# Patient Record
Sex: Male | Born: 1960 | Race: White | Hispanic: No | Marital: Single | Smoking: Never smoker
Health system: Southern US, Community
[De-identification: ages and names within clinical notes are randomized; demographics above are authoritative.]

## PROBLEM LIST (undated history)

## (undated) DIAGNOSIS — H9319 Tinnitus, unspecified ear: Secondary | ICD-10-CM

## (undated) DIAGNOSIS — I10 Essential (primary) hypertension: Secondary | ICD-10-CM

## (undated) DIAGNOSIS — F5101 Primary insomnia: Secondary | ICD-10-CM

## (undated) DIAGNOSIS — E7800 Pure hypercholesterolemia, unspecified: Secondary | ICD-10-CM

## (undated) DIAGNOSIS — E785 Hyperlipidemia, unspecified: Secondary | ICD-10-CM

## (undated) HISTORY — DX: Hyperlipidemia, unspecified: E78.5

## (undated) HISTORY — DX: Tinnitus, unspecified ear: H93.19

## (undated) HISTORY — PX: TONSILLECTOMY: SUR1361

## (undated) HISTORY — DX: Pure hypercholesterolemia, unspecified: E78.00

## (undated) HISTORY — DX: Essential (primary) hypertension: I10

## (undated) HISTORY — DX: Primary insomnia: F51.01

---
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
## 2016-02-14 ENCOUNTER — Emergency Department (HOSPITAL_COMMUNITY)
Admission: EM | Admit: 2016-02-14 | Discharge: 2016-02-14 | Disposition: A | Discharge status: Home / Self Care | Payer: BLUE CROSS/BLUE SHIELD | Attending: Emergency Medicine | Admitting: Emergency Medicine

## 2016-02-14 ENCOUNTER — Encounter (HOSPITAL_COMMUNITY): Payer: Self-pay | Admitting: *Deleted

## 2016-02-14 DIAGNOSIS — Z7982 Long term (current) use of aspirin: Secondary | ICD-10-CM | POA: Diagnosis not present

## 2016-02-14 DIAGNOSIS — X58XXXA Exposure to other specified factors, initial encounter: Secondary | ICD-10-CM | POA: Diagnosis not present

## 2016-02-14 DIAGNOSIS — S40811A Abrasion of right upper arm, initial encounter: Secondary | ICD-10-CM | POA: Diagnosis not present

## 2016-02-14 DIAGNOSIS — Y9389 Activity, other specified: Secondary | ICD-10-CM | POA: Insufficient documentation

## 2016-02-14 DIAGNOSIS — Y92137 Garden or yard on military base as the place of occurrence of the external cause: Secondary | ICD-10-CM | POA: Diagnosis not present

## 2016-02-14 DIAGNOSIS — S51801A Unspecified open wound of right forearm, initial encounter: Secondary | ICD-10-CM | POA: Insufficient documentation

## 2016-02-14 DIAGNOSIS — Y990 Civilian activity done for income or pay: Secondary | ICD-10-CM | POA: Insufficient documentation

## 2016-02-14 DIAGNOSIS — Z23 Encounter for immunization: Secondary | ICD-10-CM | POA: Diagnosis not present

## 2016-02-14 DIAGNOSIS — R42 Dizziness and giddiness: Secondary | ICD-10-CM | POA: Insufficient documentation

## 2016-02-14 DIAGNOSIS — S4991XA Unspecified injury of right shoulder and upper arm, initial encounter: Secondary | ICD-10-CM | POA: Diagnosis present

## 2016-02-14 MED ORDER — TETANUS-DIPHTH-ACELL PERTUSSIS 5-2.5-18.5 LF-MCG/0.5 IM SUSP
0.5000 mL | Freq: Once | INTRAMUSCULAR | Status: AC
  Administered 2016-02-14: 0.5 mL via INTRAMUSCULAR
  Filled 2016-02-14: qty 0.5

## 2016-02-14 MED ORDER — DOXYCYCLINE HYCLATE 100 MG PO TABS
200.0000 mg | ORAL_TABLET | Freq: Once | ORAL | Status: AC
  Administered 2016-02-14: 200 mg via ORAL
  Filled 2016-02-14: qty 2

## 2016-02-14 NOTE — ED Notes (Signed)
Pt given a cup of water 

## 2016-02-14 NOTE — ED Notes (Signed)
Per EMS pt was working in the rose garden and stuck his hand down in it a felt a sting sensation/bite. Pt believes it could be a snake but did not see an actual snake. Pt has 2 puncture marks to right forearm with minimal swelling.

## 2016-02-14 NOTE — ED Provider Notes (Signed)
Patient brought by EMS he felt mild generalized weakness after he noticed to tiny puncture marks at his right forearm while working regarding this morning. He felt a stinging sensation in his right forearm. EMS felt the possibility of a snake bite however patient did not see a snake. On exam patient is alert and in no distress right forearm shows 2 tiny puncture wounds approximately 2 cm apart  Doug SouSam Jaymz Traywick, MD 02/14/16 1649

## 2016-02-14 NOTE — ED Notes (Addendum)
Pt c/o weakness and fatigue swelling at puncture site remains the same

## 2016-02-14 NOTE — ED Provider Notes (Signed)
CSN: [id number]     Arrival date & time 02/14/16  1232 History   First MD Initiated Contact with Patient 02/14/16 1302     Chief Complaint  Patient presents with  . Animal Bite     (Consider location/radiation/quality/duration/timing/severity/associated sxs/prior Treatment) HPI Comments: Patient is a previously healthy 55 year old male who presents today with a possible abnormal bite to his right forearm. Patient reports he was working in his friend's rose garden this morning around 10 AM when he reached into an area out of vision and felt a stinging/light sensation. He then saw 2 puncture wounds/ scratches on his right forearm. The wounds are about 1 inch apart. Within 30-45 minutes the patient began feeling flushed and lightheaded. The patient was concerned that maybe he was bitten by a snake, although he did not see any snake. The patient's also reports that he pulled a tick off his lower abdomen 3 days ago. It has been red and itchy, and he has been watching it. It has not increased in size since he found the tick. Patient denies any chest pain, shortness of breath, abdominal pain, nausea, vomiting, dysuria, headaches. Patient states he is feeling well now, with the exception of some facial flushing. Tetanus not up-to-date.  Patient is a 55 y.o. male presenting with animal bite. The history is provided by the patient.  Animal Bite Associated symptoms: no fever and no rash     History reviewed. No pertinent past medical history. Past Surgical History  Procedure Laterality Date  . Tympanoplasty     No family history on file. Social History  Substance Use Topics  . Smoking status: Never Smoker   . Smokeless tobacco: Never Used  . Alcohol Use: Yes     Comment: occassional    Review of Systems  Constitutional: Negative for fever and chills.  HENT: Negative for facial swelling.   Respiratory: Negative for shortness of breath.   Cardiovascular: Negative for chest pain.   Gastrointestinal: Negative for nausea, vomiting and abdominal pain.  Genitourinary: Negative for dysuria.  Musculoskeletal: Negative for back pain.  Skin: Positive for wound. Negative for rash.  Neurological: Positive for light-headedness. Negative for headaches.  Psychiatric/Behavioral: The patient is not nervous/anxious.       Allergies  Review of patient's allergies indicates no known allergies.  Home Medications   Prior to Admission medications   Medication Sig Start Date End Date Taking? Authorizing Provider  aspirin EC 81 MG tablet Take 81 mg by mouth at bedtime.   Yes Historical Provider, MD  fluticasone (FLONASE) 50 MCG/ACT nasal spray Place 1 spray into both nostrils daily as needed for allergies (and congestion).   Yes Historical Provider, MD  ibuprofen (ADVIL,MOTRIN) 200 MG tablet Take 400-600 mg by mouth at bedtime as needed for moderate pain.   Yes Historical Provider, MD   BP 127/86 mmHg  Pulse 73  Temp(Src) 98.7 F (37.1 C) (Oral)  Resp 16  Ht 5\' 8"  (1.727 m)  Wt 104.327 kg  BMI 34.98 kg/m2  SpO2 97% Physical Exam  Constitutional: He appears well-developed and well-nourished. No distress.  HENT:  Head: Normocephalic and atraumatic.  Eyes: Conjunctivae are normal. Pupils are equal, round, and reactive to light. Right eye exhibits no discharge. Left eye exhibits no discharge. No scleral icterus.  Neck: Normal range of motion. Neck supple. No thyromegaly present.  Cardiovascular: Normal rate, regular rhythm, normal heart sounds and intact distal pulses.  Exam reveals no gallop and no friction rub.   No murmur  heard. Pulmonary/Chest: Effort normal and breath sounds normal. No stridor. No respiratory distress. He has no wheezes. He has no rales.  Abdominal: Soft. Bowel sounds are normal. He exhibits no distension. There is no tenderness. There is no rebound and no guarding.  Musculoskeletal: He exhibits no edema.  Lymphadenopathy:    He has no cervical  adenopathy.  Neurological: He is alert. Coordination normal.  Skin: Skin is warm and dry. No rash noted. He is not diaphoretic. No pallor.  2 small wounds 1 inch apart on right posterior forearm; minimal surrounding area of edema, no erythema, mild tenderness on palpation; no streaking; area demarcated by circle surrounding edema  Psychiatric: He has a normal mood and affect.  Nursing note and vitals reviewed.   ED Course  Procedures (including critical care time) Labs Review Labs Reviewed - No data to display  Imaging Review No results found. I have personally reviewed and evaluated these images and lab results as part of my medical decision-making.   EKG Interpretation None      1:28p Will watch the patient for symptom course 3:00p Patient's heaviness in legs and lightheadedness mostly resolved; drinking water 3:26p Patient's symptoms same as 3:00p; discussed prophylactic doxycycline and patient agrees to take 1  dose here in ED and follow-up with PCP; also updated tetanus in ED  MDM   Patient's symptoms improved over ED course. Patient's swelling and tenderness around wounds did not increase. Patient given prophylactic doxycycline as stated above for tick bite. Tetanus updated in ED. Patient stable throughout course and at discharge. Patient to follow-up with PCP as needed for any worsening or unresolving symptoms. Strict return precautions given. Patient discussed with Dr. Dub Mikes agreement with plan.  Final diagnoses:  Abrasion of right arm, initial encounter  [address] PA-C 02/14/16 2020  Doug Sou, MD 02/15/16 1659

## 2016-02-14 NOTE — ED Notes (Signed)
Pt states that he now remembers that he was bite by a tic on waist, area appears to be red

## 2016-02-14 NOTE — Discharge Instructions (Signed)
You were given doxycycline today to prevent any possible Lyme disease or [facility] spotted fever infection from your tick bite. Your tetanus shot has also been updated.  Treatment: Put antibiotic ointment on wound daily to prevent infection.  Follow-up: Please follow-up with your primary care provider if your symptoms are worsening or not improving. Please return to emergency department if he develops any new or worsening symptoms.   Wound Care Taking care of your wound properly can help to prevent pain and infection. It can also help your wound to heal more quickly.  HOW TO CARE FOR YOUR WOUND  Take or apply over-the-counter and prescription medicines only as told by your health care provider.  If you were prescribed antibiotic medicine, take or apply it as told by your health care provider. Do not stop using the antibiotic even if your condition improves.  Clean the wound each day or as told by your health care provider.  Wash the wound with mild soap and water.  Rinse the wound with water to remove all soap.  Pat the wound dry with a clean towel. Do not rub it.  There are many different ways to close and cover a wound. For example, a wound can be covered with stitches (sutures), skin glue, or adhesive strips. Follow instructions from your health care provider about:  How to take care of your wound.  When and how you should change your bandage (dressing).  When you should remove your dressing.  Removing whatever was used to close your wound.  Check your wound every day for signs of infection. Watch for:  Redness, swelling, or pain.  Fluid, blood, or pus.  Keep the dressing dry until your health care provider says it can be removed. Do not take baths, swim, use a hot tub, or do anything that would put your wound underwater until your health care provider approves.  Raise (elevate) the injured area above the level of your heart while you are sitting or lying down.  Do  not scratch or pick at the wound.  Keep all follow-up visits as told by your health care provider. This is important. SEEK MEDICAL CARE IF:  You received a tetanus shot and you have swelling, severe pain, redness, or bleeding at the injection site.  You have a fever.  Your pain is not controlled with medicine.  You have increased redness, swelling, or pain at the site of your wound.  You have fluid, blood, or pus coming from your wound.  You notice a bad smell coming from your wound or your dressing. SEEK IMMEDIATE MEDICAL CARE IF:  You have a red streak going away from your wound.   This information is not intended to replace advice given to you by your health care provider. Make sure you discuss any questions you have with your health care provider.   Document Released: 07/26/2008 Document Revised: 03/03/2015 Document Reviewed: 10/13/2014 Elsevier Interactive Patient Education [employer]2016 [employer] Inc.

## 2016-02-14 NOTE — ED Notes (Signed)
Pt. Verbalizes understanding of d/c instructions and displays no s/s of distress at this time. VS stable. Pt. Verbalizes no concerns at this time. Pt. Ambulatory out of the unit with steady gait.

## 2016-02-15 ENCOUNTER — Emergency Department (HOSPITAL_BASED_OUTPATIENT_CLINIC_OR_DEPARTMENT_OTHER)
Admission: EM | Admit: 2016-02-15 | Discharge: 2016-02-15 | Disposition: A | Discharge status: Home / Self Care | Payer: BLUE CROSS/BLUE SHIELD | Attending: Emergency Medicine | Admitting: Emergency Medicine

## 2016-02-15 ENCOUNTER — Encounter (HOSPITAL_BASED_OUTPATIENT_CLINIC_OR_DEPARTMENT_OTHER): Payer: Self-pay | Admitting: *Deleted

## 2016-02-15 DIAGNOSIS — Z7982 Long term (current) use of aspirin: Secondary | ICD-10-CM | POA: Diagnosis not present

## 2016-02-15 DIAGNOSIS — L539 Erythematous condition, unspecified: Secondary | ICD-10-CM | POA: Insufficient documentation

## 2016-02-15 DIAGNOSIS — R42 Dizziness and giddiness: Secondary | ICD-10-CM | POA: Diagnosis not present

## 2016-02-15 DIAGNOSIS — R232 Flushing: Secondary | ICD-10-CM | POA: Insufficient documentation

## 2016-02-15 DIAGNOSIS — L089 Local infection of the skin and subcutaneous tissue, unspecified: Secondary | ICD-10-CM | POA: Insufficient documentation

## 2016-02-15 MED ORDER — MECLIZINE HCL 12.5 MG PO TABS: 12.5000 mg | ORAL_TABLET | Freq: Three times a day (TID) | ORAL | Status: DC | PRN

## 2016-02-15 NOTE — Discharge Instructions (Signed)
You have been seen today for flushing and lightheadedness. You have no abnormalities on exam or in your vital signs. Follow up with PCP as needed if symptoms continue. Return to ED should symptoms worsen.

## 2016-02-15 NOTE — ED Notes (Signed)
C/o ?bite to right forearm. Was seen [facility] ED yesterday for same. Pt arm was marked yesterday an is not outside of [place]. Pt states area looks more reddened to him. C/o episodes of feeling weak, sweating, and feeling hot since bite.

## 2016-02-15 NOTE — ED Provider Notes (Signed)
CSN: [id number]     Arrival date & time 02/15/16  7564 History   First MD Initiated Contact with Patient 02/15/16 1008     Chief Complaint  Patient presents with  . Insect Bite     (Consider location/radiation/quality/duration/timing/severity/associated sxs/prior Treatment) HPI   Herbert Cantu is a 55 y.o. male, patient with no pertinent past medical history, presenting to the ED with Complaint of an episode of feeling flushed and lightheaded that occurred after his shower this morning. Patient states that he was seen in the [facility] ED yesterday for what he thought may have been a bite from an insect or snake. Patient was observed for hours in the ED without changes in his wound or status. She states that he had similar feelings of flushing and lightheadedness yesterday after the wound occurred. Today's episode passed within 30 seconds. Patient denies falls or head trauma. Further denies nausea/vomiting, chest pain, shortness of breath, fever/chills, or any other complaints or symptoms.   HPI by Buel Ream, PA-C from February 14, 2016: "Patient is a previously healthy 55 year old male who presents today with a possible abnormal bite to his right forearm. Patient reports he was working in his friend's rose garden this morning around 10 AM when he reached into an area out of vision and felt a stinging/light sensation. He then saw 2 puncture wounds/ scratches on his right forearm. The wounds are about 1 inch apart. Within 30-45 minutes the patient began feeling flushed and lightheaded. The patient was concerned that maybe he was bitten by a snake, although he did not see any snake. The patient's also reports that he pulled a tick off his lower abdomen 3 days ago. It has been red and itchy, and he has been watching it. It has not increased in size since he found the tick. Patient denies any chest pain, shortness of breath, abdominal pain, nausea, vomiting, dysuria, headaches. Patient states he is  feeling well now, with the exception of some facial flushing. Tetanus not up-to-date."  History reviewed. No pertinent past medical history. Past Surgical History  Procedure Laterality Date  . Tympanoplasty     No family history on file. Social History  Substance Use Topics  . Smoking status: Never Smoker   . Smokeless tobacco: Never Used  . Alcohol Use: Yes     Comment: occassional    Review of Systems  Constitutional: Negative for fever, chills and diaphoresis.  Respiratory: Negative for shortness of breath.   Cardiovascular: Negative for chest pain.  Gastrointestinal: Negative for nausea, vomiting, abdominal pain and diarrhea.  Skin: Positive for wound. Negative for color change and pallor.  Neurological: Positive for light-headedness (resolved). Negative for syncope, weakness and numbness.  All other systems reviewed and are negative.     Allergies  Review of patient's allergies indicates no known allergies.  Home Medications   Prior to Admission medications   Medication Sig Start Date End Date Taking? Authorizing Provider  aspirin EC 81 MG tablet Take 81 mg by mouth at bedtime.   Yes Historical Provider, MD  fluticasone (FLONASE) 50 MCG/ACT nasal spray Place 1 spray into both nostrils daily as needed for allergies (and congestion).   Yes Historical Provider, MD  ibuprofen (ADVIL,MOTRIN) 200 MG tablet Take 400-600 mg by mouth at bedtime as needed for moderate pain.   Yes Historical Provider, MD  meclizine (ANTIVERT) 12.5 MG tablet Take 1 tablet (12.5 mg total) by mouth 3 (three) times daily as needed for dizziness. 02/15/16   [name]  C Kadarious Dikes, PA-C   BP 139/94 mmHg  Pulse 70  Temp(Src) 98.3 F (36.8 C) (Oral)  Resp 16  SpO2 100% Physical Exam  Constitutional: He is oriented to person, place, and time. He appears well-developed and well-nourished. No distress.  HENT:  Head: Normocephalic and atraumatic.  Eyes: Conjunctivae and EOM are normal. Pupils are equal, round,  and reactive to light.  Neck: Normal range of motion. Neck supple.  Cardiovascular: Normal rate, regular rhythm, normal heart sounds and intact distal pulses.   Pulmonary/Chest: Effort normal and breath sounds normal. No respiratory distress.  Abdominal: Soft. There is no tenderness. There is no guarding.  Musculoskeletal: He exhibits no edema or tenderness.  Full ROM in all extremities and spine. No paraspinal tenderness.   Lymphadenopathy:    He has no cervical adenopathy.    He has no axillary adenopathy.  Neurological: He is alert and oriented to person, place, and time. He has normal reflexes.  No sensory deficits. Strength 5/5 in all extremities. No gait disturbance. Coordination intact. Cranial nerves III-XII grossly intact. No facial droop.   Skin: Skin is warm and dry. He is not diaphoretic.  Patient has a 3 cm, circular area of erythema and inflammation to the posterior right forearm. No open wound noted. No bleeding or exudate. No tenderness.  Psychiatric: He has a normal mood and affect. His behavior is normal.  Nursing note and vitals reviewed.   ED Course  Procedures (including critical care time)      Orthostatic VS for the past 24 hrs:  BP- Lying Pulse- Lying BP- Sitting Pulse- Sitting BP- Standing at 0 minutes Pulse- Standing at 0 minutes  02/15/16 1032 117/72 mmHg 72 130/83 mmHg 84 122/82 mmHg 78      MDM   Final diagnoses:  Flushing  Lightheadedness    Cherlynn KaiserMark Bagdasarian presents with an episode of flushing and lightheadedness that occurred just after his shower today.  Patient adds that these flushing episodes tend to occur when he begins to worry about the wound on his arm. The patient's wound from yesterday was evaluated. No extension of erythema beyond the border drawn since that time. No orthostatic changes. No neuro or functional deficits. Benign overall exam. Patient has no red flag symptoms. Symptoms did not recur during his time here in the ED. Patient  advised to follow-up with his PCP on this matter. Return precautions discussed. Patient voices understanding of these instructions and is comfortable with discharge. Patient appears safe for discharge at this time.  Filed Vitals:   02/15/16 1004  BP: 139/94  Pulse: 70  Temp: 98.3 F (36.8 C)  TempSrc: Oral  Resp: 16  SpO2: 100%     Anselm PancoastShawn C Jones Viviani, PA-C 02/15/16 1637  Geoffery Lyonsouglas Delo, MD 02/16/16 [phone]

## 2016-12-09 ENCOUNTER — Other Ambulatory Visit: Payer: Self-pay | Admitting: Family Medicine

## 2016-12-09 ENCOUNTER — Ambulatory Visit
Admission: RE | Admit: 2016-12-09 | Discharge: 2016-12-09 | Disposition: A | Discharge status: Home / Self Care | Payer: BLUE CROSS/BLUE SHIELD | Source: Ambulatory Visit | Attending: Family Medicine | Admitting: Family Medicine

## 2016-12-09 DIAGNOSIS — M545 Low back pain: Secondary | ICD-10-CM

## 2016-12-25 ENCOUNTER — Encounter (HOSPITAL_BASED_OUTPATIENT_CLINIC_OR_DEPARTMENT_OTHER): Payer: Self-pay | Admitting: Emergency Medicine

## 2016-12-25 ENCOUNTER — Emergency Department (HOSPITAL_BASED_OUTPATIENT_CLINIC_OR_DEPARTMENT_OTHER)
Admission: EM | Admit: 2016-12-25 | Discharge: 2016-12-25 | Disposition: A | Discharge status: Home / Self Care | Payer: BLUE CROSS/BLUE SHIELD | Attending: Emergency Medicine | Admitting: Emergency Medicine

## 2016-12-25 ENCOUNTER — Other Ambulatory Visit: Payer: Self-pay

## 2016-12-25 ENCOUNTER — Emergency Department (HOSPITAL_BASED_OUTPATIENT_CLINIC_OR_DEPARTMENT_OTHER): Payer: BLUE CROSS/BLUE SHIELD

## 2016-12-25 DIAGNOSIS — Z5181 Encounter for therapeutic drug level monitoring: Secondary | ICD-10-CM | POA: Insufficient documentation

## 2016-12-25 DIAGNOSIS — Z7982 Long term (current) use of aspirin: Secondary | ICD-10-CM | POA: Diagnosis not present

## 2016-12-25 DIAGNOSIS — Z79899 Other long term (current) drug therapy: Secondary | ICD-10-CM | POA: Insufficient documentation

## 2016-12-25 DIAGNOSIS — R42 Dizziness and giddiness: Secondary | ICD-10-CM | POA: Insufficient documentation

## 2016-12-25 LAB — CBC WITH DIFFERENTIAL/PLATELET
BASOS PCT: 1 %
Basophils Absolute: 0 10*3/uL (ref 0.0–0.1)
EOS ABS: 0.1 10*3/uL (ref 0.0–0.7)
EOS PCT: 2 %
HCT: 45.8 % (ref 39.0–52.0)
Hemoglobin: 16.4 g/dL (ref 13.0–17.0)
Lymphocytes Relative: 22 %
Lymphs Abs: 1.2 10*3/uL (ref 0.7–4.0)
MCH: 31.5 pg (ref 26.0–34.0)
MCHC: 35.8 g/dL (ref 30.0–36.0)
MCV: 87.9 fL (ref 78.0–100.0)
MONO ABS: 0.4 10*3/uL (ref 0.1–1.0)
MONOS PCT: 8 %
NEUTROS PCT: 67 %
Neutro Abs: 3.7 10*3/uL (ref 1.7–7.7)
Platelets: 314 10*3/uL (ref 150–400)
RBC: 5.21 MIL/uL (ref 4.22–5.81)
RDW: 12.5 % (ref 11.5–15.5)
WBC: 5.4 10*3/uL (ref 4.0–10.5)

## 2016-12-25 LAB — URINALYSIS, ROUTINE W REFLEX MICROSCOPIC
Bilirubin Urine: NEGATIVE
GLUCOSE, UA: NEGATIVE mg/dL
HGB URINE DIPSTICK: NEGATIVE
KETONES UR: NEGATIVE mg/dL
Leukocytes, UA: NEGATIVE
Nitrite: NEGATIVE
PROTEIN: NEGATIVE mg/dL
Specific Gravity, Urine: 1.005 (ref 1.005–1.030)
pH: 7.5 (ref 5.0–8.0)

## 2016-12-25 LAB — COMPREHENSIVE METABOLIC PANEL
ALT: 24 U/L (ref 17–63)
AST: 25 U/L (ref 15–41)
Albumin: 4.6 g/dL (ref 3.5–5.0)
Alkaline Phosphatase: 83 U/L (ref 38–126)
Anion gap: 8 (ref 5–15)
BILIRUBIN TOTAL: 0.5 mg/dL (ref 0.3–1.2)
BUN: 15 mg/dL (ref 6–20)
CO2: 27 mmol/L (ref 22–32)
CREATININE: 0.94 mg/dL (ref 0.61–1.24)
Calcium: 9.7 mg/dL (ref 8.9–10.3)
Chloride: 103 mmol/L (ref 101–111)
GFR calc Af Amer: >60 mL/min (ref >60)
Glucose, Bld: 120 mg/dL — ABNORMAL HIGH (ref 65–99)
POTASSIUM: 3.6 mmol/L (ref 3.5–5.1)
Sodium: 138 mmol/L (ref 135–145)
TOTAL PROTEIN: 7.9 g/dL (ref 6.5–8.1)

## 2016-12-25 LAB — RAPID URINE DRUG SCREEN, HOSP PERFORMED
AMPHETAMINES: NOT DETECTED
BARBITURATES: NOT DETECTED
Benzodiazepines: NOT DETECTED
Cocaine: NOT DETECTED
Opiates: NOT DETECTED
Tetrahydrocannabinol: NOT DETECTED

## 2016-12-25 LAB — LIPASE, BLOOD: LIPASE: 31 U/L (ref 11–51)

## 2016-12-25 LAB — PROTIME-INR
INR: 0.98
PROTHROMBIN TIME: 13 s (ref 11.4–15.2)

## 2016-12-25 LAB — TROPONIN I: Troponin I: <0.03 ng/mL (ref <0.03)

## 2016-12-25 MED ORDER — SODIUM CHLORIDE 0.9 % IV SOLN
Freq: Once | INTRAVENOUS | Status: AC
  Administered 2016-12-25: 12:00:00 via INTRAVENOUS

## 2016-12-25 NOTE — ED Triage Notes (Signed)
Yesterday had a episode of dizziness upon standing and again this am around 0900. Generalized weakness , denies pain

## 2016-12-25 NOTE — ED Provider Notes (Signed)
[facility]   CSN: [id number] Arrival date & time: 12/25/16  1017     History   Chief Complaint Chief Complaint  Patient presents with  . Dizziness    HPI Herbert Cantu is a 56 y.o. male.  HPI Patient states he became lightheaded today while taking a shower. He began to feel as though he may be getting close to passing out. He developed some feeling of weakness in his legs. He lay down for about 30 minutes but didn't feel better and at that time he determined to come to the emergency department. He reports he has been well recently. He notes that he has had some type of an aching discomfort in his right shoulder and back of his neck. He also notes that when this episode occurred he might have had somewhat of a warm or unusual sensation in his chest. He does not qualify it as pressure or pain. History reviewed. No pertinent past medical history.  There are no active problems to display for this patient.   Past Surgical History:  Procedure Laterality Date  . TONSILLECTOMY    . TYMPANOPLASTY         Home Medications    Prior to Admission medications   Medication Sig Start Date End Date Taking? Authorizing Provider  aspirin EC 81 MG tablet Take 81 mg by mouth at bedtime.   Yes Historical Provider, MD  fluticasone (FLONASE) 50 MCG/ACT nasal spray Place 1 spray into both nostrils daily as needed for allergies (and congestion).   Yes Historical Provider, MD  ibuprofen (ADVIL,MOTRIN) 200 MG tablet Take 400-600 mg by mouth at bedtime as needed for moderate pain.   Yes Historical Provider, MD  magnesium oxide (MAG-OX) 400 MG tablet Take 500 mg by mouth daily.   Yes Historical Provider, MD  meclizine (ANTIVERT) 12.5 MG tablet Take 1 tablet (12.5 mg total) by mouth 3 (three) times daily as needed for dizziness. 02/15/16   Anselm PancoastShawn C Joy, PA-C    Family History Denies early coronary artery disease or sudden death in family members.  Social History Social  History  Substance Use Topics  . Smoking status: Never Smoker  . Smokeless tobacco: Never Used  . Alcohol use Yes     Comment: occassional     Allergies   Patient has no known allergies.   Review of Systems Review of Systems 10 Systems reviewed and are negative for acute change except as noted in the HPI.   Physical Exam Updated Vital Signs BP 129/83 (BP Location: Left Arm)   Pulse 74   Temp 98.5 F (36.9 C) (Oral)   Resp 16   Ht 5\' 8"  (1.727 m)   Wt 230 lb (104.3 kg)   SpO2 98%   BMI 34.97 kg/m   Physical Exam  Constitutional: He appears well-developed and well-nourished.  HENT:  Head: Normocephalic and atraumatic.  Eyes: Conjunctivae are normal.  Neck: Neck supple.  Cardiovascular: Normal rate and regular rhythm.   No murmur heard. Pulmonary/Chest: Effort normal and breath sounds normal. No respiratory distress.  Abdominal: Soft. There is no tenderness.  Musculoskeletal: He exhibits no edema.  Neurological: He is alert.  Skin: Skin is warm and dry.  Psychiatric: He has a normal mood and affect.  Nursing note and vitals reviewed.    ED Treatments / Results  Labs (all labs ordered are listed, but only abnormal results are displayed) Labs Reviewed  COMPREHENSIVE METABOLIC PANEL - Abnormal; Notable for the following:  Result Value   Glucose, Bld 120 (*)    All other components within normal limits  LIPASE, BLOOD  TROPONIN I  CBC WITH DIFFERENTIAL/PLATELET  PROTIME-INR  URINALYSIS, ROUTINE W REFLEX MICROSCOPIC  RAPID URINE DRUG SCREEN, HOSP PERFORMED    EKG  EKG Interpretation  Date/Time:  Sunday December 25 2016 11:07:03 EST Ventricular Rate:  72 PR Interval:  142 QRS Duration: 152 QT Interval:  406 QTC Calculation: 444 R Axis:   -2 Text Interpretation:  Sinus rhythm with marked sinus arrhythmia Right bundle branch block Abnormal ECG agree. no old Confirmed by Donnald Garre, MD, Lebron Conners [phone]) on 12/25/2016 11:27:30 AM       Radiology Dg  Chest 2 View  Result Date: 12/25/2016 CLINICAL DATA:  Burgess Estelle had a episode of dizziness upon standing and again this am around 0900. Generalized weakness EXAM: CHEST  2 VIEW COMPARISON:  12/14/2016 FINDINGS: Cardiac silhouette is normal in size and configuration. Normal mediastinal and hilar contours. Clear lungs.  No pleural effusion or pneumothorax. Skeletal structures are intact. IMPRESSION: No active cardiopulmonary disease. Electronically Signed   By: Amie Portland M.D.   On: 12/25/2016 12:54    Procedures Procedures (including critical care time)  Medications Ordered in ED Medications  0.9 %  sodium chloride infusion ( Intravenous New Bag/Given 12/25/16 1202)     Initial Impression / Assessment and Plan / ED Course  I have reviewed the triage vital signs and the nursing notes.  Pertinent labs & imaging results that were available during my care of the patient were reviewed by me and considered in my medical decision making (see chart for details).    Cardiology:ECG reviewed. No acute ischemic appearance. Right bundle branch block.  Final Clinical Impressions(s) / ED Diagnoses   Final diagnoses:  Lightheadedness   Patient had an episode of nonspecific lightheadedness. He did not experience syncope. Any chest discomfort is extremely atypical in that patient describes as somewhat warm sensation and maybe flushing of his neck. Patient does not endorse exertional dyspnea or chest pain. He has not been ill recently. Diagnostic studies are within normal limits. Patient does have a right bundle-branch block with no old comparison EKG available. There is however no dysrhythmia and cardiology reviewed EKG which does not show suspicious appearance for ischemia. Patient's heart scores 2. At this time, plan will be for the patient to follow-up with his outpatient provider [facility] and discuss follow-up stress testing. New Prescriptions New Prescriptions   No medications on file     Arby Barrette, MD 12/25/16 1447

## 2017-02-20 ENCOUNTER — Encounter: Payer: Self-pay | Admitting: Physician Assistant

## 2017-02-27 ENCOUNTER — Encounter: Payer: Self-pay | Admitting: *Deleted

## 2017-02-28 ENCOUNTER — Encounter: Payer: Self-pay | Admitting: Physician Assistant

## 2017-02-28 ENCOUNTER — Ambulatory Visit (INDEPENDENT_AMBULATORY_CARE_PROVIDER_SITE_OTHER): Payer: BLUE CROSS/BLUE SHIELD | Admitting: Physician Assistant

## 2017-02-28 ENCOUNTER — Other Ambulatory Visit (INDEPENDENT_AMBULATORY_CARE_PROVIDER_SITE_OTHER): Payer: BLUE CROSS/BLUE SHIELD

## 2017-02-28 VITALS — BP 122/74 | HR 77 | Ht 68.0 in | Wt 224.0 lb

## 2017-02-28 DIAGNOSIS — R630 Anorexia: Secondary | ICD-10-CM | POA: Diagnosis not present

## 2017-02-28 DIAGNOSIS — R634 Abnormal weight loss: Secondary | ICD-10-CM

## 2017-02-28 DIAGNOSIS — R531 Weakness: Secondary | ICD-10-CM

## 2017-02-28 LAB — CBC WITH DIFFERENTIAL/PLATELET
BASOS PCT: 0.7 % (ref 0.0–3.0)
Basophils Absolute: 0.1 10*3/uL (ref 0.0–0.1)
EOS PCT: 3 % (ref 0.0–5.0)
Eosinophils Absolute: 0.2 10*3/uL (ref 0.0–0.7)
HEMATOCRIT: 45 % (ref 39.0–52.0)
Hemoglobin: 15.4 g/dL (ref 13.0–17.0)
LYMPHS ABS: 1.6 10*3/uL (ref 0.7–4.0)
LYMPHS PCT: 22.6 % (ref 12.0–46.0)
MCHC: 34.1 g/dL (ref 30.0–36.0)
MCV: 91.8 fl (ref 78.0–100.0)
MONOS PCT: 9.1 % (ref 3.0–12.0)
Monocytes Absolute: 0.6 10*3/uL (ref 0.1–1.0)
NEUTROS ABS: 4.6 10*3/uL (ref 1.4–7.7)
Neutrophils Relative %: 64.6 % (ref 43.0–77.0)
PLATELETS: 329 10*3/uL (ref 150.0–400.0)
RBC: 4.91 Mil/uL (ref 4.22–5.81)
RDW: 13.2 % (ref 11.5–15.5)
WBC: 7.1 10*3/uL (ref 4.0–10.5)

## 2017-02-28 LAB — BASIC METABOLIC PANEL
BUN: 17 mg/dL (ref 6–23)
CALCIUM: 9.9 mg/dL (ref 8.4–10.5)
CHLORIDE: 103 meq/L (ref 96–112)
CO2: 30 mEq/L (ref 19–32)
CREATININE: 0.83 mg/dL (ref 0.40–1.50)
GFR: 101.18 mL/min (ref >60.00)
Glucose, Bld: 101 mg/dL — ABNORMAL HIGH (ref 70–99)
Potassium: 4.8 mEq/L (ref 3.5–5.1)
Sodium: 140 mEq/L (ref 135–145)

## 2017-02-28 LAB — VITAMIN B12: Vitamin B-12: 296 pg/mL (ref 211–911)

## 2017-02-28 LAB — FOLATE

## 2017-02-28 LAB — HIGH SENSITIVITY CRP: CRP, High Sensitivity: 1.38 mg/L (ref 0.000–5.000)

## 2017-02-28 LAB — SEDIMENTATION RATE: SED RATE: 9 mm/h (ref 0–20)

## 2017-02-28 LAB — TSH: TSH: 1.73 u[IU]/mL (ref 0.35–4.50)

## 2017-02-28 NOTE — Progress Notes (Signed)
Subjective:    Patient ID: Herbert Cantu, male    DOB: 03-20-1961, 56 y.o.   MRN: [id number]  HPI Herbert Cantu is a pleasant 56 year old white male, new to GI today referred by Dr. Tally Joe M.D. for evaluation of recent loss of appetite and weight loss.  Patient had undergone previous screening colonoscopy in 2011 done through [facility]. We did obtain a copy of that report and showed colonoscopy done by Dr. Amil Amen January 2012 with one single sessile polyp removed from the sigmoid colon. Path showed a benign hyperplastic polyp. Patient says his current issues started towards the end of February early March when he lost his appetite. He has not had any nausea vomiting or queasiness, rather says she is just not hungry. His weight had dropped from 233 to about 218. He has been pushing himself to eat despite lack of appetite. He has had some dull discomfort in his upper abdomen which she says sometimes bothers him early in the morning and has awakened him from sleep. Bowel movements have been fairly normal no melena or hematochezia. Denies any heartburn or indigestion, no dysphagia. He did have a coughing or choking episode a couple of weeks ago but that was isolated and denies any dysphagia with by mouth intake. He took a two-week course of Nexium without change in any of his symptoms. Only new medication was Cozaar which started in January. He also had an ER visit in February 2018 after an episode of lightheadedness and dizziness. EKG had been done which she says was abnormal and then he had subsequent stress testing done per Dr. Viann Fish which was negative. Exline Other complaints or significant difficulty sleeping. He says he is very fatigued but has been waking up usually at 3-4 AM generally uncomfortable with discomfort in his chest and abdomen and back. He has also noticed weakness in both of his legs and arms over the past 4-6 weeks. He seems to feel the weakness below his knees  bilaterally, denies any paresthesias or numbness. He says generally he "feels terrible". He denies feeling stressed or depressed other than being a bit down about feeling poorly.   Review of Systems Pertinent positive and negative review of systems were noted in the above HPI section.  All other review of systems was otherwise negative.  Outpatient Encounter Prescriptions as of 02/28/2017  Medication Sig  . aspirin EC 81 MG tablet Take 81 mg by mouth at bedtime.  . fluticasone (FLONASE) 50 MCG/ACT nasal spray Place 1 spray into both nostrils daily as needed for allergies (and congestion).  Marland Kitchen ibuprofen (ADVIL,MOTRIN) 200 MG tablet Take 400-600 mg by mouth at bedtime as needed for moderate pain.  Marland Kitchen losartan (COZAAR) 50 MG tablet Take 50 mg by mouth daily.  . magnesium oxide (MAG-OX) 400 MG tablet Take 500 mg by mouth daily.  . meloxicam (MOBIC) 15 MG tablet Take 15 mg by mouth daily.  . [DISCONTINUED] meclizine (ANTIVERT) 12.5 MG tablet Take 1 tablet (12.5 mg total) by mouth 3 (three) times daily as needed for dizziness.   No facility-administered encounter medications on file as of 02/28/2017.    No Known Allergies There are no active problems to display for this patient.  Social History   Social History  . Marital status: Single    Spouse name: N/A  . Number of children: N/A  . Years of education: N/A   Occupational History  . Not on file.   Social History Main Topics  . Smoking  status: Never Smoker  . Smokeless tobacco: Never Used  . Alcohol use Yes     Comment: occassional  . Drug use: No  . Sexual activity: Not on file   Other Topics Concern  . Not on file   Social History Narrative  . No narrative on file    Herbert Cantu family history includes Diabetes in his mother; Other in his sister.      Objective:    Vitals:   02/28/17 0859  BP: 122/74  Pulse: 77    Physical Exam  well-developed white male in no acute distress, pleasant, fatigued-appearing blood  pressure 122/74 pulse 77, height 5 foot 8, weight 224, BMI 34.0. HEENT; nontraumatic normocephalic EOMI PERRLA sclera anicteric, Cardiovascular; regular rate and rhythm with S1-S2 no murmur or gallop, Pulmonary; clear bilaterally, Abdomen; soft, obese he has some mild upper abdominal tenderness rather generalized no palpable mass or hepatosplenomegaly no guarding or rebound is and, Rectal ;exam not done, Extremities; no clubbing cyanosis or edema skin warm and dry, Neuropsych; mood and affect appropriate,       Assessment & Plan:   #93  56 year old white male with 2 month history of change in appetite/lack of appetite with weight loss of about 15 pounds. He has had some vague upper abdominal discomfort but no other significant GI symptoms. Etiology of symptoms is not clear, rule out occult malignancy, rule out possible adverse medication effect i.e. Cozaar #2 significant fatigue #3 recent insomnia secondary to discomfort #4 upper and lower extremity weakness #5 colon cancer surveillance-1 hyperplastic polyp at colonoscopy 2012-interval follow-up due to 2022  Plan; CBC with differential, sedimentation rate, CRP, TSH, ANA, BMET Will schedule for CT of the abdomen and pelvis with contrast If CT is unrevealing may need EGD with Dr. Christella Hartigan. Suspect with the above constellation of symptoms he may have non-GI etiology-and may need further GI workup to rule out other etiologies i.e. polymyositis, MS etc.  Herbert Cooper PA-C 02/28/2017   Cc: Tally Joe, MD

## 2017-02-28 NOTE — Patient Instructions (Addendum)
Please go to the basement level to have your labs drawn.   You have been scheduled for a CT scan of the abdomen and pelvis at [facility] ([address]---this is in the same building as Press [facility]).   You are scheduled on 03-07-2017 at 3:00 PM. You should arrive 15 minutes prior to your appointment time for registration. Please follow the written instructions below on the day of your exam:  WARNING: IF YOU ARE ALLERGIC TO IODINE/X-RAY DYE, PLEASE NOTIFY RADIOLOGY IMMEDIATELY AT [id number]! YOU WILL BE GIVEN A 13 HOUR PREMEDICATION PREP.  1) Do not eat or drink anything after 10:00 am (4 hours prior to your test) 2) You have been given 2 bottles of oral contrast to drink. The solution may taste               better if refrigerated, but do NOT add ice or any other liquid to this solution. Shake             well before drinking.    Drink 1 bottle of contrast @ 1:00 PM(2 hours prior to your exam)  Drink 1 bottle of contrast @ 2:00 PM (1 hour prior to your exam)  You may take any medications as prescribed with a small amount of water except for the following: Metformin, Glucophage, Glucovance, Avandamet, Riomet, Fortamet, Actoplus Met, Janumet, Glumetza or Metaglip. The above medications must be held the day of the exam AND 48 hours after the exam.  The purpose of you drinking the oral contrast is to aid in the visualization of your intestinal tract. The contrast solution may cause some diarrhea. Before your exam is started, you will be given a small amount of fluid to drink. Depending on your individual set of symptoms, you may also receive an intravenous injection of x-ray contrast/dye. Plan on being at [facility] for 30 minutes or longer, depending on the type of exam you are having performed.  This test typically takes 30-45 minutes to complete.  If you have any questions regarding your exam or if you need to reschedule, you may call the CT department at  [phone] between the hours of 8:00 am and 5:00 pm, Monday-Friday.  ________________________________________________________________________

## 2017-02-28 NOTE — Progress Notes (Signed)
I agree with the above note, plan 

## 2017-03-03 ENCOUNTER — Ambulatory Visit (INDEPENDENT_AMBULATORY_CARE_PROVIDER_SITE_OTHER): Payer: BLUE CROSS/BLUE SHIELD | Admitting: Neurology

## 2017-03-03 ENCOUNTER — Encounter: Payer: Self-pay | Admitting: Neurology

## 2017-03-03 VITALS — BP 132/86 | HR 73 | Ht 68.0 in | Wt 221.4 lb

## 2017-03-03 DIAGNOSIS — G2581 Restless legs syndrome: Secondary | ICD-10-CM

## 2017-03-03 DIAGNOSIS — R531 Weakness: Secondary | ICD-10-CM

## 2017-03-03 DIAGNOSIS — R5382 Chronic fatigue, unspecified: Secondary | ICD-10-CM

## 2017-03-03 DIAGNOSIS — M791 Myalgia, unspecified site: Secondary | ICD-10-CM

## 2017-03-03 NOTE — Progress Notes (Signed)
[facility]    Provider:  Dr Jaynee Eagles Referring Provider: Suella Broad, MD Primary Care Physician:  Gara Kroner, MD  CC:  weakness  HPI:  Herbert Cantu is a 56 y.o. male here as a referral from Dr. Nelva Bush for arm and leg weakness. Past medical history of lumbar facet arthropathy. Symptoms started with back pain in February and he went to [facility], he was treated with meloxicam, did not complete therapy. He had an episode of hours of dizziness and lightheadedness. In March he started losing his appetite. He started having problems sleeping and not feeling well. He "feels so bad" every day. He feels sad most days. He feel generally weak in the arms and legs. No falls. Noticed the weakness in the last few months, progressively worsening. He struggles to work. He lays on the couch after work which helps. Working and lifting all day worsens it. No appetite. No sensory. Most nights he tosses and turns, meloxicam helps. He has muscle sensitivity especially in the calfs but no numbness, burning tingling. He has restless legs in the evening. No neck pain. He does have warmth coming down his nack to his groin area and this wakes him up. No falls. Changes in bowel/bladder. More difficulty walking but no ataxia. No ptosis, dipolpia, vision changes. He has blurry vision, changes in peripheral vision.   Reviewed notes, labs and imaging from outside physicians, which showed:  Reviewed [place] [facility] notes. He is a 56 year old male who has been followed further pack. He has lumbar facet arthropathy, pain in the neck and the low back, weakness of the bilateral extremities and numbness and tingling in the feet. Has progressed to bilateral extremity weakness. MRI showed degenerative disc disease worse at L3-L4 with aerobic endplate changes may be some mild central canal stenosis at that level with a very minimal central disc protrusion at L5-S1. No high-grade central canal  stenosis is noted. He does have facet arthropathy at the lower 3 levels bilaterally. He complains more of weakness and not pain.  Reviewed MRI of the lumbar spine which showed multilevel facet arthropathy, no significant foraminal stenosis or appearing nerve impingement, slight disc bulging at L4-L5 and L5-S1, moderate disc degeneration at L3-L4.  Review of Systems: Patient complains of symptoms per HPI as well as the following symptoms: Fatigue, weakness, depression. No chest pain or shortness of breath. Pertinent negatives per HPI. All others negative.   Social History   Social History  . Marital status: Single    Spouse name: N/A  . Number of children: 0  . Years of education: College   Occupational History  . [employer]    Social History Main Topics  . Smoking status: Never Smoker  . Smokeless tobacco: Former Systems developer    Types: Chew    Quit date: 33  . Alcohol use Yes     Comment: occassional  . Drug use: No  . Sexual activity: Not on file   Other Topics Concern  . Not on file   Social History Narrative   Lives at home w/ his cat   Right-handed   Caffeine: 1-2 coffee per day    Family History  Problem Relation Age of Onset  . Diabetes Mother   . Other Sister     Spinal tumor  . Other Father     Sepsis  . Parkinson's disease Paternal Grandmother   . Other Maternal Aunt     Brain lesion  . Colon cancer Neg Hx   .  Neuromuscular disorder Neg Hx     Past Medical History:  Diagnosis Date  . Elevated LDL cholesterol level   . Essential hypertension   . Hyperlipidemia   . Primary insomnia   . Tinnitus     Past Surgical History:  Procedure Laterality Date  . TONSILLECTOMY  1960s  . TYMPANOPLASTY  1978    Current Outpatient Prescriptions  Medication Sig Dispense Refill  . aspirin EC 81 MG tablet Take 81 mg by mouth at bedtime.    . fluticasone (FLONASE) 50 MCG/ACT nasal spray Place 1 spray into both nostrils daily as needed for allergies (and  congestion).    Marland Kitchen ibuprofen (ADVIL,MOTRIN) 200 MG tablet Take 400-600 mg by mouth at bedtime as needed for moderate pain.    Marland Kitchen losartan (COZAAR) 50 MG tablet Take 50 mg by mouth daily.    . meloxicam (MOBIC) 15 MG tablet Take 15 mg by mouth daily.    . naproxen sodium (ANAPROX) 220 MG tablet Take 220 mg by mouth 2 (two) times daily as needed.     No current facility-administered medications for this visit.     Allergies as of 03/03/2017  . (No Known Allergies)    Vitals: BP 132/86   Pulse 73   Ht 5' 8"  (1.727 m)   Wt 221 lb 6.4 oz (100.4 kg)   BMI 33.66 kg/m  Last Weight:  Wt Readings from Last 1 Encounters:  03/03/17 221 lb 6.4 oz (100.4 kg)   Last Height:   Ht Readings from Last 1 Encounters:  03/03/17 5' 8"  (1.727 m)    Physical exam: Exam: Gen: NAD, conversant, well nourised, obese, well groomed                     CV: RRR, no MRG. No Carotid Bruits. No peripheral edema, warm, nontender Eyes: Conjunctivae clear without exudates or hemorrhage  Neuro: Detailed Neurologic Exam  Speech:    Speech is normal; fluent and spontaneous with normal comprehension.  Cognition:    The patient is oriented to person, place, and time;     recent and remote memory intact;     language fluent;     normal attention, concentration,     fund of knowledge Cranial Nerves:    The pupils are equal, round, and reactive to light. The fundi are normal and spontaneous venous pulsations are present. Visual fields are full to finger confrontation. Extraocular movements are intact. Trigeminal sensation is intact and the muscles of mastication are normal. The face is symmetric. The palate elevates in the midline. Hearing intact. Voice is normal. Shoulder shrug is normal. The tongue has normal motion without fasciculations.   Coordination:    Normal finger to nose and heel to shin. Normal rapid alternating movements.   Gait:    Heel-toe and tandem gait are normal.   Motor Observation:    No  asymmetry, no atrophy, and no involuntary movements noted. Tone:    Normal muscle tone.    Posture:    Posture is normal. normal erect    Strength:Right proximal weakness otherwise strength is V/V in the upper and lower limbs.      Sensation: intact to LT     Reflex Exam:  DTR's:    Deep tendon reflexes in the upper and lower extremities are brisk in lowers bilaterally.   Toes:    The toes are downgoing bilaterally.   Clonus:    2 beats Clonus AJs  Assessment/Plan:  56 year old male here for evaluation of progressive weakness, fatigue, dizziness, difficulty with gait which is progressively worsening.  - Patient reports episodes of vision loss in the periphery. Given this finding and is progressive weakness, dizziness need to evaluate for strokes, other intracranial etiology with MRI of the brain with and without contrast. - Given patient's difficulty with ambulation, brisk lower extremity reflexes, proximal weakness in the right leg and several beats clonus recommend MRI of the cervical spine to evaluate for myelopathy and possible surgical intervention if needed. - Labs today  - emg/ncs to evaluate for peripheral nerve disorder or neuromuscular disorder. - Patient has symptoms of depression I do recommend following with primary care to further evaluate need for treatment and proceed to the emergency room if mood significantly worsens or if he has any thoughts of harming himself or others.  Orders Placed This Encounter  Procedures  . MR BRAIN W WO CONTRAST  . MR CERVICAL SPINE WO CONTRAST  . Acetylcholine receptor, binding  . Acetylcholine receptor, blocking  . Acetylcholine receptor, modulating  . CK  . Heavy metals, blood  . Multiple Myeloma Panel (SPEP&IFE w/QIG)  . Vitamin B1  . Methylmalonic acid, serum  . Ferritin  . Hepatic function panel  . Vitamin B12  . Vitamin D, 25-hydroxy  . NCV with EMG(electromyography)   Cc: Dr. Nelva Bush, Dr. Rennie Natter,  MD  [facility] [address] [place], [place] [address]  Phone [phone] Fax [phone]

## 2017-03-03 NOTE — Patient Instructions (Signed)
Remember to drink plenty of fluid, eat healthy meals and do not skip any meals. Try to eat protein with a every meal and eat a healthy snack such as fruit or nuts in between meals. Try to keep a regular sleep-wake schedule and try to exercise daily, particularly in the form of walking, 20-30 minutes a day, if you can.   As far as diagnostic testing: Labs, MRI brain and cervical spine, emg/ncs  I would like to see you back for emg/ncs, sooner if we need to. Please call us with any interim questions, concerns, problems, updates or refill requests.   Our phone number is [phone]. We also have an after hours call service for urgent matters and there is a physician on-call for urgent questions. For any emergencies you know to call 911 or go to the nearest emergency room

## 2017-03-07 ENCOUNTER — Ambulatory Visit (INDEPENDENT_AMBULATORY_CARE_PROVIDER_SITE_OTHER)
Admission: RE | Admit: 2017-03-07 | Discharge: 2017-03-07 | Disposition: A | Discharge status: Home / Self Care | Payer: BLUE CROSS/BLUE SHIELD | Source: Ambulatory Visit | Attending: Physician Assistant | Admitting: Physician Assistant

## 2017-03-07 ENCOUNTER — Encounter: Payer: Self-pay | Admitting: Neurology

## 2017-03-07 DIAGNOSIS — R634 Abnormal weight loss: Secondary | ICD-10-CM | POA: Diagnosis not present

## 2017-03-07 DIAGNOSIS — R630 Anorexia: Secondary | ICD-10-CM

## 2017-03-07 DIAGNOSIS — R531 Weakness: Secondary | ICD-10-CM

## 2017-03-07 MED ORDER — IOPAMIDOL (ISOVUE-300) INJECTION 61%
100.0000 mL | Freq: Once | INTRAVENOUS | Status: AC | PRN
  Administered 2017-03-07: 100 mL via INTRAVENOUS

## 2017-03-08 ENCOUNTER — Telehealth: Payer: Self-pay | Admitting: Neurology

## 2017-03-08 NOTE — Telephone Encounter (Signed)
Sent notes for MRI cervical Sine and  MRI Brain . Notes faxed to [facility] [employer] Telephone [phone] - fax [phone] .  [facility]

## 2017-03-09 ENCOUNTER — Encounter: Payer: Self-pay | Admitting: Neurology

## 2017-03-09 ENCOUNTER — Ambulatory Visit (INDEPENDENT_AMBULATORY_CARE_PROVIDER_SITE_OTHER): Payer: Self-pay | Admitting: Neurology

## 2017-03-09 ENCOUNTER — Telehealth: Payer: Self-pay | Admitting: Physician Assistant

## 2017-03-09 ENCOUNTER — Ambulatory Visit (INDEPENDENT_AMBULATORY_CARE_PROVIDER_SITE_OTHER): Payer: BLUE CROSS/BLUE SHIELD | Admitting: Neurology

## 2017-03-09 DIAGNOSIS — G2581 Restless legs syndrome: Secondary | ICD-10-CM

## 2017-03-09 DIAGNOSIS — R531 Weakness: Secondary | ICD-10-CM | POA: Diagnosis not present

## 2017-03-09 DIAGNOSIS — Z0289 Encounter for other administrative examinations: Secondary | ICD-10-CM

## 2017-03-09 DIAGNOSIS — R5382 Chronic fatigue, unspecified: Secondary | ICD-10-CM

## 2017-03-09 DIAGNOSIS — M791 Myalgia, unspecified site: Secondary | ICD-10-CM

## 2017-03-09 NOTE — Progress Notes (Signed)
See procedure note.

## 2017-03-10 ENCOUNTER — Telehealth: Payer: Self-pay | Admitting: Neurology

## 2017-03-10 NOTE — Telephone Encounter (Signed)
[facility] Fannie KneeSue to give her additional clinical information.Marland Kitchen. She was unable to get it approved on her level.. The member ID is [id number] & the DOB is 1961/10/12.. The phone number for the peer to peer is [phone].. The case close's on Tuesday 03/14/17.

## 2017-03-11 NOTE — Progress Notes (Signed)
Full Name: Herbert Cantu Gender: Male MRN #: [id number] Date of Birth: Mar 20, 1961    Visit Date: 03/09/2017 08:05 Age: 56 Years 10 Months Old Examining Physician: Naomie DeanAntonia Stephaniemarie Stoffel, MD  Referring Physician: Sheran Luzamos, Richard MD  History: 56 year old male here for evaluation of progressive weakness, fatigue, dizziness, difficulty with gait.    Summary: EMG/NCS was performed on the bilateral lower extremities and left upper extremity  All nerves and muscles (as detailed in the following tables) were normal  Conclusion: This is a normal study.  Naomie DeanAntonia Keiasha Diep, M.D.  [facility] [address] [place], [place] [address] Tel: [phone] Fax: [phone]   Cc: Sheran Luzamos, Richard MD     [facility]    Nerve / Sites Rec. Site Latency Ref. Amplitude Ref. Rel Amp Segments Distance Velocity Ref. Area    ms ms mV mV %  cm m/s m/s mVms  L Median - APB     Wrist APB 3.3 ?4.4 5.6 ?4.0 100 Wrist - APB 7   21.2     Upper arm APB 7.4  5.7  101 Upper arm - Wrist 22 54 ?49 20.7  L Ulnar - ADM     Wrist ADM 2.4 ?3.3 8.4 ?6.0 100 Wrist - ADM 7   32.1     B.Elbow ADM 5.6  7.9  95.2 B.Elbow - Wrist 19 59 ?49 31.3     A.Elbow ADM 7.7  8.0  100 A.Elbow - B.Elbow 12 59 ?49 31.7         A.Elbow - Wrist      L Peroneal - EDB     Ankle EDB 4.9 ?6.5 6.6 ?2.0 100 Ankle - EDB 9   18.7     Fib head EDB 11.5  5.4  82.2 Fib head - Ankle 29 44 ?44 16.3     Pop fossa EDB 13.6  5.0  92.9 Pop fossa - Fib head 10 47 ?44 15.6         Pop fossa - Ankle      R Peroneal - EDB     Ankle EDB 5.1 ?6.5 7.4 ?2.0 100 Ankle - EDB 9   27.4     Fib head EDB 11.0  8.4  113 Fib head - Ankle 29 49 ?44 31.0     Pop fossa EDB 13.1  8.1  96.6 Pop fossa - Fib head 10 48 ?44 29.7         Acc Peron - Pop fossa      L Tibial - AH     Ankle AH 3.8 ?5.8 16.6 ?4.0 100 Ankle - AH 9   59.0     Pop fossa AH 12.8  10.7  64.6 Pop fossa - Ankle 38 42 ?41 43.5  R Tibial - AH     Ankle AH 3.9 ?5.8 17.4 ?4.0 100 Ankle - AH 9   54.0    Pop fossa AH 12.9  13.6  77.9 Pop fossa - Ankle 38 42 ?41 49.3                 SNC    Nerve / Sites Rec. Site Peak Lat Ref.  Amp Ref. Segments Distance    ms ms V V  cm  L Sural - Ankle (Calf)     Calf Ankle 3.80 ?4.40 12 ?6 Calf - Ankle 14  R Sural - Ankle (Calf)     Calf Ankle 3.28 ?4.40 29 ?6 Calf - Ankle 14  L Superficial peroneal - Ankle     Lat leg Ankle 3.85 ?4.40 11 ?6 Lat leg - Ankle 14  R Superficial peroneal - Ankle     Lat leg Ankle 3.80 ?4.40 13 ?6 Lat leg - Ankle 14  L Median - Orthodromic (Dig II, Mid palm)     Dig II Wrist 2.76 ?3.40 28 ?10 Dig II - Wrist 13  L Ulnar - Orthodromic, (Dig V, Mid palm)     Dig V Wrist 2.71 ?3.10 10 ?5 Dig V - Wrist 24                 F  Wave    Nerve F Lat Ref.   ms ms  L Peroneal - EDB 48.9 ?56.0  L Tibial - AH 52.8 ?56.0  R Peroneal - EDB 47.9 ?56.0  R Tibial - AH 52.7 ?56.0  L Median - APB 27.5 ?31.0  L Ulnar - ADM 27.8 ?32.0                 EMG full       EMG Summary Table    Spontaneous MUAP Recruitment  Muscle IA Fib PSW Fasc Other Amp Dur. Poly Pattern  L. Deltoid Normal None None None _______ Normal Normal Normal Normal  L. Biceps brachii Normal None None None _______ Normal Normal Normal Normal  L. Triceps brachii Normal None None None _______ Normal Normal Normal Normal  L. Cervical paraspinals (low) Normal None None None _______ Normal Normal Normal Normal  L. Pronator teres Normal None None None _______ Normal Normal Normal Normal  L. Opponens pollicis Normal None None None _______ Normal Normal Normal Normal  L. First dorsal interosseous Normal None None None _______ Normal Normal Normal Normal  L. Biceps femoris (long head) Normal None None None _______ Normal Normal Normal Normal  L. Vastus medialis Normal None None None _______ Normal Normal Normal Normal  L. Tibialis anterior Normal None None None _______ Normal Normal Normal Normal  L. Gastrocnemius (Medial head) Normal None None None _______ Normal  Normal Normal Normal  L. Gluteus maximus Normal None None None _______ Normal Normal Normal Normal  L. Lumbar paraspinals (low) Normal None None None _______ Normal Normal Normal Normal  L. Iliopsoas Normal None None None _______ Normal Normal Normal Normal

## 2017-03-13 LAB — ACETYLCHOLINE RECEPTOR, BLOCKING: Acetylchol Block Ab: 11 % (ref 0–25)

## 2017-03-13 LAB — MULTIPLE MYELOMA PANEL, SERUM
ALBUMIN SERPL ELPH-MCNC: 4.1 g/dL (ref 2.9–4.4)
ALPHA2 GLOB SERPL ELPH-MCNC: 0.6 g/dL (ref 0.4–1.0)
Albumin/Glob SerPl: 1.5 (ref 0.7–1.7)
Alpha 1: 0.2 g/dL (ref 0.0–0.4)
B-GLOBULIN SERPL ELPH-MCNC: 0.9 g/dL (ref 0.7–1.3)
Gamma Glob SerPl Elph-Mcnc: 1 g/dL (ref 0.4–1.8)
Globulin, Total: 2.8 g/dL (ref 2.2–3.9)
IGG (IMMUNOGLOBIN G), SERUM: 959 mg/dL (ref 700–1600)
IGM (IMMUNOGLOBULIN M), SRM: 96 mg/dL (ref 20–172)
IgA/Immunoglobulin A, Serum: 186 mg/dL (ref 90–386)
TOTAL PROTEIN: 6.9 g/dL (ref 6.0–8.5)

## 2017-03-13 LAB — VITAMIN B1: Thiamine: 128.8 nmol/L (ref 66.5–200.0)

## 2017-03-13 LAB — HEPATIC FUNCTION PANEL
ALK PHOS: 86 IU/L (ref 39–117)
ALT: 18 IU/L (ref 0–44)
AST: 13 IU/L (ref 0–40)
Albumin: 4.7 g/dL (ref 3.5–5.5)
BILIRUBIN TOTAL: 0.5 mg/dL (ref 0.0–1.2)
BILIRUBIN, DIRECT: 0.14 mg/dL (ref 0.00–0.40)

## 2017-03-13 LAB — HEAVY METALS, BLOOD
ARSENIC: 6 ug/L (ref 2–23)
LEAD, BLOOD: NOT DETECTED ug/dL (ref 0–19)
MERCURY: NOT DETECTED ug/L (ref 0.0–14.9)

## 2017-03-13 LAB — FERRITIN: Ferritin: 331 ng/mL (ref 30–400)

## 2017-03-13 LAB — VITAMIN D 25 HYDROXY (VIT D DEFICIENCY, FRACTURES): Vit D, 25-Hydroxy: 28.6 ng/mL — ABNORMAL LOW (ref 30.0–100.0)

## 2017-03-13 LAB — ACETYLCHOLINE RECEPTOR, MODULATING: Acetylcholine Modulat Ab: <12 % (ref 0–20)

## 2017-03-13 LAB — METHYLMALONIC ACID, SERUM: METHYLMALONIC ACID: 98 nmol/L (ref 0–378)

## 2017-03-13 LAB — CK: CK TOTAL: 75 U/L (ref 24–204)

## 2017-03-13 LAB — ACETYLCHOLINE RECEPTOR, BINDING: AChR Binding Ab, Serum: <0.03 nmol/L (ref 0.00–0.24)

## 2017-03-13 LAB — VITAMIN B12: VITAMIN B 12: 383 pg/mL (ref 232–1245)

## 2017-03-13 NOTE — Telephone Encounter (Signed)
Patient is aware 

## 2017-03-13 NOTE — Telephone Encounter (Signed)
Noted, thank you

## 2017-03-14 ENCOUNTER — Encounter: Payer: Self-pay | Admitting: Neurology

## 2017-03-14 NOTE — Telephone Encounter (Signed)
A nurse from [employer] just called me and informed me that the MRI Brain was approved but the MRI Cervical was denied.. If you would like to do a peer to peer the phone number is [phone] and the case close's today..Marland Kitchen

## 2017-03-14 NOTE — Telephone Encounter (Signed)
Lets do the brain first and then if negative the cervicalk spine. Can you withdraw the cervical spine? Thanks!

## 2017-03-14 NOTE — Telephone Encounter (Signed)
Noted, thank you I will withdraw the MRI Cervical and proceed with scheduling the Brain.

## 2017-03-15 MED ORDER — ALPRAZOLAM 0.5 MG PO TABS: ORAL_TABLET | ORAL | 0 refills | Status: DC

## 2017-03-15 NOTE — Telephone Encounter (Signed)
I scheduled the patient MRI Brain for 03/22/17 at our [facility] mobile unit... He informed me that he is claustrophic and needs something to help him..Marland Kitchen

## 2017-03-15 NOTE — Telephone Encounter (Signed)
Signed and faxed to pharmacy

## 2017-03-15 NOTE — Telephone Encounter (Signed)
Discussed w/ Dr. Lucia GaskinsAhern. Xanax rx printed, awaiting signature.

## 2017-03-20 ENCOUNTER — Ambulatory Visit (AMBULATORY_SURGERY_CENTER): Payer: Self-pay | Admitting: *Deleted

## 2017-03-20 VITALS — Ht 68.0 in | Wt 219.6 lb

## 2017-03-20 DIAGNOSIS — R634 Abnormal weight loss: Secondary | ICD-10-CM

## 2017-03-20 NOTE — Progress Notes (Signed)
Patient denies any allergies to eggs or soy. Patient denies any problems with anesthesia/sedation. Patient denies any oxygen use at home and does not take any diet/weight loss medications. EMMI education assisgned to patient on EGD, this was explained and instructions given to patient. 

## 2017-03-22 ENCOUNTER — Ambulatory Visit (INDEPENDENT_AMBULATORY_CARE_PROVIDER_SITE_OTHER): Payer: BLUE CROSS/BLUE SHIELD

## 2017-03-22 DIAGNOSIS — M791 Myalgia, unspecified site: Secondary | ICD-10-CM

## 2017-03-22 DIAGNOSIS — R5382 Chronic fatigue, unspecified: Secondary | ICD-10-CM

## 2017-03-22 DIAGNOSIS — G2581 Restless legs syndrome: Secondary | ICD-10-CM | POA: Diagnosis not present

## 2017-03-22 DIAGNOSIS — R531 Weakness: Secondary | ICD-10-CM

## 2017-03-22 MED ORDER — GADOPENTETATE DIMEGLUMINE 469.01 MG/ML IV SOLN: 15.0000 mL | Freq: Once | INTRAVENOUS | Status: AC | PRN

## 2017-03-28 ENCOUNTER — Encounter: Payer: Self-pay | Admitting: Gastroenterology

## 2017-03-29 ENCOUNTER — Encounter: Payer: Self-pay | Admitting: Neurology

## 2017-04-05 ENCOUNTER — Ambulatory Visit (INDEPENDENT_AMBULATORY_CARE_PROVIDER_SITE_OTHER): Payer: BLUE CROSS/BLUE SHIELD

## 2017-04-05 DIAGNOSIS — R531 Weakness: Secondary | ICD-10-CM

## 2017-04-05 DIAGNOSIS — M791 Myalgia, unspecified site: Secondary | ICD-10-CM

## 2017-04-05 DIAGNOSIS — R5382 Chronic fatigue, unspecified: Secondary | ICD-10-CM | POA: Diagnosis not present

## 2017-04-05 DIAGNOSIS — G2581 Restless legs syndrome: Secondary | ICD-10-CM

## 2017-04-06 ENCOUNTER — Encounter: Payer: Self-pay | Admitting: Gastroenterology

## 2017-04-10 ENCOUNTER — Ambulatory Visit (AMBULATORY_SURGERY_CENTER): Payer: BLUE CROSS/BLUE SHIELD | Admitting: Gastroenterology

## 2017-04-10 ENCOUNTER — Encounter: Payer: Self-pay | Admitting: Gastroenterology

## 2017-04-10 VITALS — BP 117/82 | HR 87 | Temp 98.7°F | Resp 20 | Ht 68.0 in | Wt 219.0 lb

## 2017-04-10 DIAGNOSIS — R630 Anorexia: Secondary | ICD-10-CM | POA: Diagnosis not present

## 2017-04-10 DIAGNOSIS — K2970 Gastritis, unspecified, without bleeding: Secondary | ICD-10-CM

## 2017-04-10 DIAGNOSIS — R531 Weakness: Secondary | ICD-10-CM

## 2017-04-10 DIAGNOSIS — R634 Abnormal weight loss: Secondary | ICD-10-CM

## 2017-04-10 MED ORDER — SODIUM CHLORIDE 0.9 % IV SOLN: 500.0000 mL | INTRAVENOUS | Status: AC

## 2017-04-10 NOTE — Progress Notes (Signed)
Report given to PACU, vss 

## 2017-04-10 NOTE — Progress Notes (Signed)
Called to room to assist during endoscopic procedure.  Patient ID and intended procedure confirmed with present staff. Received instructions for my participation in the procedure from the performing physician.  

## 2017-04-10 NOTE — Patient Instructions (Signed)
YOU HAD AN ENDOSCOPIC PROCEDURE TODAY AT THE [facility]:   Refer to the procedure report that was given to you for any specific questions about what was found during the examination.  If the procedure report does not answer your questions, please call your gastroenterologist to clarify.  If you requested that your care partner not be given the details of your procedure findings, then the procedure report has been included in a sealed envelope for you to review at your convenience later.  YOU SHOULD EXPECT: Some feelings of bloating in the abdomen. Passage of more gas than usual.  Walking can help get rid of the air that was put into your GI tract during the procedure and reduce the bloating. If you had a lower endoscopy (such as a colonoscopy or flexible sigmoidoscopy) you may notice spotting of blood in your stool or on the toilet paper. If you underwent a bowel prep for your procedure, you may not have a normal bowel movement for a few days.  Please Note:  You might notice some irritation and congestion in your nose or some drainage.  This is from the oxygen used during your procedure.  There is no need for concern and it should clear up in a day or so.  SYMPTOMS TO REPORT IMMEDIATELY:   Following upper endoscopy (EGD)  Vomiting of blood or coffee ground material  New chest pain or pain under the shoulder blades  Painful or persistently difficult swallowing  New shortness of breath  Fever of 100F or higher  Black, tarry-looking stools  For urgent or emergent issues, a gastroenterologist can be reached at any hour by calling [phone].  Please read all handouts given to you by your recovery nurse.   DIET:  We do recommend a small meal at first, but then you may proceed to your regular diet.  Drink plenty of fluids but you should avoid alcoholic beverages for 24 hours.  ACTIVITY:  You should plan to take it easy for the rest of today and you should NOT DRIVE or use heavy  machinery until tomorrow (because of the sedation medicines used during the test).    FOLLOW UP: Our staff will call the number listed on your records the next business day following your procedure to check on you and address any questions or concerns that you may have regarding the information given to you following your procedure. If we do not reach you, we will leave a message.  However, if you are feeling well and you are not experiencing any problems, there is no need to return our call.  We will assume that you have returned to your regular daily activities without incident.  If any biopsies were taken you will be contacted by phone or by letter within the next 1-3 weeks.  Please call us at [phone] if you have not heard about the biopsies in 3 weeks.    SIGNATURES/CONFIDENTIALITY: You and/or your care partner have signed paperwork which will be entered into your electronic medical record.  These signatures attest to the fact that that the information above on your After Visit Summary has been reviewed and is understood.  Full responsibility of the confidentiality of this discharge information lies with you and/or your care-partner.  Thank you for letting us take care of your healthcare needs today. 

## 2017-04-10 NOTE — Op Note (Signed)
[facility] Patient Name: Herbert KaiserMark Eckenrode Procedure Date: 04/10/2017 9:42 AM MRN: [id number] Endoscopist: Rachael Feeaniel P Jake Fuhrmann , MD Age: [age] Referring MD:  Date of Birth: 28-Feb-1961 Gender: Male Account #: [id number] Procedure:                Upper GI endoscopy Indications:              Anorexia, Weight loss; labs (cbc, cmet) normal; CT                            scan abd pelvis essentially normal. Neurologic                            workup ongoing. Medicines:                Monitored Anesthesia Care Procedure:                Pre-Anesthesia Assessment:                           - Prior to the procedure, a History and Physical                            was performed, and patient medications and                            allergies were reviewed. The patient's tolerance of                            previous anesthesia was also reviewed. The risks                            and benefits of the procedure and the sedation                            options and risks were discussed with the patient.                            All questions were answered, and informed consent                            was obtained. Prior Anticoagulants: The patient has                            taken no previous anticoagulant or antiplatelet                            agents. ASA Grade Assessment: II - A patient with                            mild systemic disease. After reviewing the risks                            and benefits, the patient was deemed in  satisfactory condition to undergo the procedure.                           After obtaining informed consent, the endoscope was                            passed under direct vision. Throughout the                            procedure, the patient's blood pressure, pulse, and                            oxygen saturations were monitored continuously. The                            Endoscope was introduced through the mouth,  and                            advanced to the second part of duodenum. The upper                            GI endoscopy was accomplished without difficulty.                            The patient tolerated the procedure well. Scope In: Scope Out: Findings:                 The esophagus was normal.                           Moderate inflammation characterized by erosions,                            erythema and friability was found in the gastric                            antrum. Biopsies were taken with a cold forceps for                            histology.                           The examined duodenum was normal. Complications:            No immediate complications. Estimated blood loss:                            None. Estimated Blood Loss:     Estimated blood loss: none. Impression:               - Moderate gastritis, biopsied to check for H.                            pylori. Recommendation:           - Patient has a contact number available for  emergencies. The signs and symptoms of potential                            delayed complications were discussed with the                            patient. Return to normal activities tomorrow.                            Written discharge instructions were provided to the                            patient.                           - Resume previous diet.                           - Continue present medications.                           - Await pathology results. Rachael Fee, MD 04/10/2017 9:59:42 AM This report has been signed electronically.

## 2017-04-11 ENCOUNTER — Telehealth: Payer: Self-pay | Admitting: *Deleted

## 2017-04-11 ENCOUNTER — Encounter: Payer: Self-pay | Admitting: Neurology

## 2017-04-11 NOTE — Telephone Encounter (Signed)
  Follow up Call-no answer, left message to call if questions or concerns.     

## 2017-04-11 NOTE — Telephone Encounter (Signed)
  Follow up Call-  Call back number 04/10/2017  Post procedure Call Back phone  # [phone]  Permission to leave phone message Yes  Some recent data might be hidden     Patient questions:  Do you have a fever, pain , or abdominal swelling? No. Pain Score  0 *  Have you tolerated food without any problems? Yes.    Have you been able to return to your normal activities? Yes.    Do you have any questions about your discharge instructions: Diet   No. Medications  No. Follow up visit  No.  Do you have questions or concerns about your Care? No.  Actions: * If pain score is 4 or above: No action needed, pain <4.  Pt. States he "doesn't feel well today"  He states he doesn't feel it is related to endoscopy Because he has been feeling badly "for some time".  Encouraged to call us back if needed and await biopsy results.

## 2017-04-15 ENCOUNTER — Encounter (HOSPITAL_COMMUNITY): Payer: Self-pay

## 2017-04-15 ENCOUNTER — Emergency Department (HOSPITAL_COMMUNITY)
Admission: EM | Admit: 2017-04-15 | Discharge: 2017-04-15 | Disposition: A | Discharge status: Home / Self Care | Payer: BLUE CROSS/BLUE SHIELD | Attending: Emergency Medicine | Admitting: Emergency Medicine

## 2017-04-15 DIAGNOSIS — E7800 Pure hypercholesterolemia, unspecified: Secondary | ICD-10-CM | POA: Diagnosis not present

## 2017-04-15 DIAGNOSIS — I10 Essential (primary) hypertension: Secondary | ICD-10-CM | POA: Insufficient documentation

## 2017-04-15 DIAGNOSIS — Z7982 Long term (current) use of aspirin: Secondary | ICD-10-CM | POA: Insufficient documentation

## 2017-04-15 DIAGNOSIS — E785 Hyperlipidemia, unspecified: Secondary | ICD-10-CM | POA: Diagnosis not present

## 2017-04-15 DIAGNOSIS — E860 Dehydration: Secondary | ICD-10-CM | POA: Diagnosis not present

## 2017-04-15 DIAGNOSIS — Z79899 Other long term (current) drug therapy: Secondary | ICD-10-CM | POA: Diagnosis not present

## 2017-04-15 DIAGNOSIS — R531 Weakness: Secondary | ICD-10-CM

## 2017-04-15 LAB — CBC WITH DIFFERENTIAL/PLATELET
BASOS ABS: 0 10*3/uL (ref 0.0–0.1)
Basophils Relative: 0 %
EOS ABS: 0.3 10*3/uL (ref 0.0–0.7)
EOS PCT: 3 %
HCT: 44.3 % (ref 39.0–52.0)
Hemoglobin: 15.6 g/dL (ref 13.0–17.0)
LYMPHS ABS: 2.2 10*3/uL (ref 0.7–4.0)
LYMPHS PCT: 22 %
MCH: 31.4 pg (ref 26.0–34.0)
MCHC: 35.2 g/dL (ref 30.0–36.0)
MCV: 89.1 fL (ref 78.0–100.0)
MONO ABS: 0.7 10*3/uL (ref 0.1–1.0)
Monocytes Relative: 7 %
Neutro Abs: 6.8 10*3/uL (ref 1.7–7.7)
Neutrophils Relative %: 68 %
PLATELETS: 355 10*3/uL (ref 150–400)
RBC: 4.97 MIL/uL (ref 4.22–5.81)
RDW: 12.6 % (ref 11.5–15.5)
WBC: 10 10*3/uL (ref 4.0–10.5)

## 2017-04-15 LAB — COMPREHENSIVE METABOLIC PANEL
ALT: 21 U/L (ref 17–63)
AST: 17 U/L (ref 15–41)
Albumin: 4.4 g/dL (ref 3.5–5.0)
Alkaline Phosphatase: 75 U/L (ref 38–126)
Anion gap: 10 (ref 5–15)
BUN: 12 mg/dL (ref 6–20)
CHLORIDE: 98 mmol/L — AB (ref 101–111)
CO2: 24 mmol/L (ref 22–32)
Calcium: 9.4 mg/dL (ref 8.9–10.3)
Creatinine, Ser: 0.84 mg/dL (ref 0.61–1.24)
GFR calc Af Amer: >60 mL/min (ref >60)
Glucose, Bld: 97 mg/dL (ref 65–99)
POTASSIUM: 3.5 mmol/L (ref 3.5–5.1)
Sodium: 132 mmol/L — ABNORMAL LOW (ref 135–145)
Total Bilirubin: 1 mg/dL (ref 0.3–1.2)
Total Protein: 7.5 g/dL (ref 6.5–8.1)

## 2017-04-15 MED ORDER — SODIUM CHLORIDE 0.9 % IV BOLUS (SEPSIS)
2000.0000 mL | Freq: Once | INTRAVENOUS | Status: AC
  Administered 2017-04-15: 2000 mL via INTRAVENOUS

## 2017-04-15 NOTE — ED Provider Notes (Signed)
[facility]-EMERGENCY DEPT Provider Note   CSN: [id number] Arrival date & time: 04/15/17  2034     History   Chief Complaint Chief Complaint  Patient presents with  . Fatigue  . decreased appetite    HPI Herbert Cantu is a 56 y.o. male.  Patient complains of weakness fatigue and dehydration. He has been evaluated for this by his doctor before. No recent has been found for his significant fatigue   The history is provided by the patient. No language interpreter was used.  Weakness  Primary symptoms include no focal weakness. This is a recurrent problem. The current episode started more than 2 days ago. The problem has not changed since onset.There was no focality noted. There has been no fever. Pertinent negatives include no shortness of breath, no chest pain and no headaches. Associated medical issues do not include trauma.    Past Medical History:  Diagnosis Date  . Elevated LDL cholesterol level   . Essential hypertension   . Hyperlipidemia   . Primary insomnia   . Tinnitus     There are no active problems to display for this patient.   Past Surgical History:  Procedure Laterality Date  . TONSILLECTOMY  1960s  . TYMPANOPLASTY  1978       Home Medications    Prior to Admission medications   Medication Sig Start Date End Date Taking? Authorizing Provider  aspirin 325 MG tablet Take 650 mg by mouth once.   Yes [provider]  aspirin EC 81 MG tablet Take 81 mg by mouth at bedtime.   Yes [provider]  DULoxetine (CYMBALTA) 30 MG capsule Take 30 mg by mouth at bedtime. 04/14/17  Yes [provider]  losartan (COZAAR) 50 MG tablet Take 50 mg by mouth daily.   Yes [provider]  meloxicam (MOBIC) 15 MG tablet Take 15 mg by mouth daily.   Yes [provider]  ALPRAZolam Prudy Feeler(XANAX) 0.5 MG tablet Take 1 tablet up to one hour prior to MRI for anxiety, may repeat x 1 if needed. Patient not taking: Reported on 04/15/2017 03/15/17    Anson FretAhern, Antonia B, MD    Family History Family History  Problem Relation Age of Onset  . Diabetes Mother   . Other Sister        Spinal tumor  . Other Father        Sepsis  . Parkinson's disease Paternal Grandmother   . Other Maternal Aunt        Brain lesion  . Colon cancer Neg Hx   . Neuromuscular disorder Neg Hx     Social History Social History  Substance Use Topics  . Smoking status: Never Smoker  . Smokeless tobacco: Former NeurosurgeonUser    Types: Chew    Quit date: 31980  . Alcohol use Yes     Comment: occassional     Allergies   Patient has no known allergies.   Review of Systems Review of Systems  Constitutional: Negative for appetite change and fatigue.  HENT: Negative for congestion, ear discharge and sinus pressure.   Eyes: Negative for discharge.  Respiratory: Negative for cough and shortness of breath.   Cardiovascular: Negative for chest pain.  Gastrointestinal: Negative for abdominal pain and diarrhea.  Genitourinary: Negative for frequency and hematuria.  Musculoskeletal: Negative for back pain.  Skin: Negative for rash.  Neurological: Positive for weakness. Negative for focal weakness, seizures and headaches.  Psychiatric/Behavioral: Negative for hallucinations.     Physical  Exam Updated Vital Signs BP (!) 137/92   Pulse 72   Temp 99 F (37.2 C) (Oral)   Resp 18   SpO2 97%   Physical Exam  Constitutional: He is oriented to person, place, and time. He appears well-developed.  HENT:  Head: Normocephalic.  Eyes: Conjunctivae and EOM are normal. No scleral icterus.  Neck: Neck supple. No thyromegaly present.  Cardiovascular: Normal rate and regular rhythm.  Exam reveals no gallop and no friction rub.   No murmur heard. Pulmonary/Chest: No stridor. He has no wheezes. He has no rales. He exhibits no tenderness.  Abdominal: He exhibits no distension. There is no tenderness. There is no rebound.  Musculoskeletal: Normal range of motion. He exhibits  no edema.  Lymphadenopathy:    He has no cervical adenopathy.  Neurological: He is oriented to person, place, and time. He exhibits normal muscle tone. Coordination normal.  Skin: No rash noted. No erythema.  Psychiatric: He has a normal mood and affect. His behavior is normal.     ED Treatments / Results  Labs (all labs ordered are listed, but only abnormal results are displayed) Labs Reviewed  COMPREHENSIVE METABOLIC PANEL - Abnormal; Notable for the following:       Result Value   Sodium 132 (*)    Chloride 98 (*)    All other components within normal limits  CBC WITH DIFFERENTIAL/PLATELET    EKG  EKG Interpretation None       Radiology No results found.  Procedures Procedures (including critical care time)  Medications Ordered in ED Medications  sodium chloride 0.9 % bolus 2,000 mL (2,000 mLs Intravenous New Bag/Given 04/15/17 2155)     Initial Impression / Assessment and Plan / ED Course  I have reviewed the triage vital signs and the nursing notes.  Pertinent labs & imaging results that were available during my care of the patient were reviewed by me and considered in my medical decision making (see chart for details).     Labs unremarkable. Patient was given 2 L of fluids and his symptoms improved. I suspect some of his problems with dehydration. He will follow-up with his PCP  Final Clinical Impressions(s) / ED Diagnoses   Final diagnoses:  Weakness  Dehydration    New Prescriptions New Prescriptions   No medications on file     Bethann Berkshire, MD 04/15/17 2326

## 2017-04-15 NOTE — ED Notes (Signed)
Patient presents to ED for assessment of generalized pain, lower leg weakness, decreased appetite, general malaise.  All of these issues increasing over the past 2 months, has been seen by specialists, PCP.  Pt started Cymbalta 30mg , first dose last night.  Pt AxO at triage.  Blood drawn and held for future orders

## 2017-04-15 NOTE — Discharge Instructions (Signed)
Drink plenty of fluids and follow-up with your doctor this week if not improving otherwise she can see him next week as scheduled

## 2017-04-18 ENCOUNTER — Other Ambulatory Visit: Payer: Self-pay | Admitting: Family Medicine

## 2017-04-18 DIAGNOSIS — E041 Nontoxic single thyroid nodule: Secondary | ICD-10-CM

## 2017-04-21 ENCOUNTER — Ambulatory Visit
Admission: RE | Admit: 2017-04-21 | Discharge: 2017-04-21 | Disposition: A | Discharge status: Home / Self Care | Payer: BLUE CROSS/BLUE SHIELD | Source: Ambulatory Visit | Attending: Family Medicine | Admitting: Family Medicine

## 2017-04-21 DIAGNOSIS — E041 Nontoxic single thyroid nodule: Secondary | ICD-10-CM

## 2017-05-02 ENCOUNTER — Telehealth: Payer: Self-pay | Admitting: Endocrinology

## 2017-05-02 NOTE — Telephone Encounter (Signed)
New pt appt ref from david swayne to dr Everardo AllEllison. Appt Dr Everardo AllEllison 06/16/17 @ 9:30am. Pt confirmed. New pt ref and paperwork placed in Dr tray at front office.

## 2017-05-15 ENCOUNTER — Encounter: Payer: Self-pay | Admitting: Neurology

## 2017-05-15 ENCOUNTER — Other Ambulatory Visit: Payer: Self-pay | Admitting: Neurology

## 2017-05-15 DIAGNOSIS — R258 Other abnormal involuntary movements: Secondary | ICD-10-CM

## 2017-05-15 DIAGNOSIS — R252 Cramp and spasm: Secondary | ICD-10-CM

## 2017-05-15 DIAGNOSIS — G959 Disease of spinal cord, unspecified: Secondary | ICD-10-CM

## 2017-05-15 DIAGNOSIS — G1221 Amyotrophic lateral sclerosis: Secondary | ICD-10-CM

## 2017-05-15 DIAGNOSIS — G1229 Other motor neuron disease: Secondary | ICD-10-CM

## 2017-05-17 ENCOUNTER — Ambulatory Visit (INDEPENDENT_AMBULATORY_CARE_PROVIDER_SITE_OTHER): Payer: BLUE CROSS/BLUE SHIELD

## 2017-05-17 DIAGNOSIS — R258 Other abnormal involuntary movements: Secondary | ICD-10-CM

## 2017-05-17 DIAGNOSIS — G1229 Other motor neuron disease: Secondary | ICD-10-CM

## 2017-05-17 DIAGNOSIS — G959 Disease of spinal cord, unspecified: Secondary | ICD-10-CM | POA: Diagnosis not present

## 2017-05-17 DIAGNOSIS — R252 Cramp and spasm: Secondary | ICD-10-CM | POA: Diagnosis not present

## 2017-05-17 DIAGNOSIS — G1221 Amyotrophic lateral sclerosis: Secondary | ICD-10-CM

## 2017-05-25 ENCOUNTER — Encounter: Payer: Self-pay | Admitting: Neurology

## 2017-05-29 ENCOUNTER — Encounter: Payer: Self-pay | Admitting: Neurology

## 2017-05-30 ENCOUNTER — Telehealth: Payer: Self-pay | Admitting: Neurology

## 2017-05-30 ENCOUNTER — Other Ambulatory Visit: Payer: Self-pay | Admitting: Neurology

## 2017-05-30 MED ORDER — GABAPENTIN 300 MG PO CAPS: 300.0000 mg | ORAL_CAPSULE | Freq: Every day | ORAL | 11 refills | Status: AC

## 2017-05-30 NOTE — Telephone Encounter (Signed)
Spoke to patient, he is ahving a lot of restless leg symptoms. Suggested ferritin lab. Started gabapentin at night, discussed side effects, patient will call back with results. Candise BowensJen, [place]

## 2017-05-30 NOTE — Telephone Encounter (Signed)
Pt states that he is awaiting a call back from Dr Lucia GaskinsAhern sometime today, advised pt she has been with pt's through out the day, he stated he is sure she will call but wanted this message sent as a reminder to please give him a call.

## 2017-05-31 ENCOUNTER — Encounter: Payer: Self-pay | Admitting: Neurology

## 2017-06-01 ENCOUNTER — Encounter: Payer: Self-pay | Admitting: Neurology

## 2017-06-06 ENCOUNTER — Other Ambulatory Visit: Payer: Self-pay | Admitting: Endocrinology

## 2017-06-06 DIAGNOSIS — E042 Nontoxic multinodular goiter: Secondary | ICD-10-CM

## 2017-06-08 ENCOUNTER — Encounter: Payer: Self-pay | Admitting: Neurology

## 2017-06-15 ENCOUNTER — Telehealth: Payer: Self-pay

## 2017-06-15 NOTE — Telephone Encounter (Signed)
Received faxed ferritin level - 313.5 (normal range 23.9-336.2) from [facility]. Sent to med records for scanning, copy to Dr. Lucia GaskinsAhern for review.

## 2017-06-16 ENCOUNTER — Ambulatory Visit: Payer: BLUE CROSS/BLUE SHIELD | Admitting: Endocrinology

## 2017-06-22 ENCOUNTER — Encounter: Payer: Self-pay | Admitting: Neurology

## 2017-06-22 ENCOUNTER — Other Ambulatory Visit (HOSPITAL_COMMUNITY)
Admission: RE | Admit: 2017-06-22 | Discharge: 2017-06-22 | Disposition: A | Discharge status: Home / Self Care | Payer: BLUE CROSS/BLUE SHIELD | Source: Ambulatory Visit | Attending: Interventional Radiology | Admitting: Interventional Radiology

## 2017-06-22 ENCOUNTER — Ambulatory Visit
Admission: RE | Admit: 2017-06-22 | Discharge: 2017-06-22 | Disposition: A | Discharge status: Home / Self Care | Payer: BLUE CROSS/BLUE SHIELD | Source: Ambulatory Visit | Attending: Endocrinology | Admitting: Endocrinology

## 2017-06-22 DIAGNOSIS — E042 Nontoxic multinodular goiter: Secondary | ICD-10-CM

## 2017-06-22 DIAGNOSIS — E041 Nontoxic single thyroid nodule: Secondary | ICD-10-CM | POA: Diagnosis present

## 2017-06-25 ENCOUNTER — Encounter: Payer: Self-pay | Admitting: Neurology

## 2017-06-26 ENCOUNTER — Telehealth: Payer: Self-pay | Admitting: Neurology

## 2017-06-26 NOTE — Telephone Encounter (Signed)
Pt said gabapentin is not helping RLS. He is experiencing achey pain lower back, lower legs. He said after lying down for about hour he has pain in the shin, calf and then it throbs up to the low back then to head continuing every 10-15 seconds.  Symptoms ease off around 6-7 am.  He said gabapentin 300mg  is not helping. Pt has appt with [facility] 8/28 @ 11:30.  (Dr Lucia Gaskins pt)  Lorain Childes

## 2017-06-26 NOTE — Telephone Encounter (Signed)
Called pt to scheduld appt with MM,NP. He already has appt scheduled tomorrow with MM,NP at 1130am. Nothing further needed at this time.  Advised him to bring insurance cards, updated med list.

## 2017-06-27 ENCOUNTER — Encounter: Payer: Self-pay | Admitting: Adult Health

## 2017-06-27 ENCOUNTER — Ambulatory Visit (INDEPENDENT_AMBULATORY_CARE_PROVIDER_SITE_OTHER): Payer: BLUE CROSS/BLUE SHIELD | Admitting: Adult Health

## 2017-06-27 VITALS — BP 119/81 | HR 82 | Ht 68.0 in | Wt 222.6 lb

## 2017-06-27 DIAGNOSIS — M79604 Pain in right leg: Secondary | ICD-10-CM | POA: Diagnosis not present

## 2017-06-27 DIAGNOSIS — E559 Vitamin D deficiency, unspecified: Secondary | ICD-10-CM | POA: Diagnosis not present

## 2017-06-27 MED ORDER — CYCLOBENZAPRINE HCL 5 MG PO TABS: 5.0000 mg | ORAL_TABLET | Freq: Every evening | ORAL | 0 refills | Status: AC | PRN

## 2017-06-27 NOTE — Progress Notes (Signed)
PATIENT: Herbert Cantu DOB: 04/23/1961  REASON FOR VISIT: follow up- Weakness  HISTORY FROM: patient  HISTORY OF PRESENT ILLNESS: Today 06/27/17 [name] is a 56 year old male with a history of weakness and discomfort in the lower extremity. He returns today for follow-up. The patient has had a thorough workup including MRI of the cervical, thoracic and lumbar spine. All of which have been relatively unremarkable. The patient is also had blood work with the exception of a low vitamin D level all others have been unremarkable. The patient states that he continues to have discomfort in the lower extremities primarily the right leg at bedtime. He states that he typically has no discomfort today. He describes the sensation as a tightening sensation in the right leg that starts in the lower leg and extends up the leg to the low back. He states occasionally he'll have some pain in the left leg but is primarily the right. He does not describe it as a jumping sensation. He states the way that he relieves the pain is to get up and walk or sometimes a hot bath will be beneficial. He states that at this point he is unable to sleep at night due to this discomfort. He has tried gabapentin 300 mg at bedtime with no benefit. He denies any significant depression. He states occasionally he will have anxiety but feels that it is relatively manageable. He returns today for an evaluation  HISTORY 03/03/17:  Herbert Cantu is a 56 y.o. male here as a referral from Dr. Ethelene Hal for arm and leg weakness. Past medical history of lumbar facet arthropathy. Symptoms started with back pain in February and he went to [facility], he was treated with meloxicam, did not complete therapy. He had an episode of hours of dizziness and lightheadedness. In March he started losing his appetite. He started having problems sleeping and not feeling well. He "feels so bad" every day. He feels sad most days. He feel generally weak in  the arms and legs. No falls. Noticed the weakness in the last few months, progressively worsening. He struggles to work. He lays on the couch after work which helps. Working and lifting all day worsens it. No appetite. No sensory. Most nights he tosses and turns, meloxicam helps. He has muscle sensitivity especially in the calfs but no numbness, burning tingling. He has restless legs in the evening. No neck pain. He does have warmth coming down his nack to his groin area and this wakes him up. No falls. Changes in bowel/bladder. More difficulty walking but no ataxia. No ptosis, dipolpia, vision changes. He has blurry vision, changes in peripheral vision.   Reviewed notes, labs and imaging from outside physicians, which showed:  Reviewed [facility]. He is a 56 year old male who has been followed further pack. He has lumbar facet arthropathy, pain in the neck and the low back, weakness of the bilateral extremities and numbness and tingling in the feet. Has progressed to bilateral extremity weakness. MRI showed degenerative disc disease worse at L3-L4 with aerobic endplate changes may be some mild central canal stenosis at that level with a very minimal central disc protrusion at L5-S1. No high-grade central canal stenosis is noted. He does have facet arthropathy at the lower 3 levels bilaterally. He complains more of weakness and not pain.  Reviewed MRI of the lumbar spine which showed multilevel facet arthropathy, no significant foraminal stenosis or appearing nerve impingement, slight disc bulging at L4-L5 and L5-S1, moderate  disc degeneration at L3-L4.  REVIEW OF SYSTEMS: Out of a complete 14 system review of symptoms, the patient complains only of the following symptoms, and all other reviewed systems are negative.  ALLERGIES: Allergies  Allergen Reactions  . Cymbalta [Duloxetine Hcl]     Nausea/ diarrhea     HOME MEDICATIONS: Outpatient Medications Prior to Visit    Medication Sig Dispense Refill  . aspirin EC 81 MG tablet Take 81 mg by mouth at bedtime.    Marland Kitchen losartan (COZAAR) 50 MG tablet Take 50 mg by mouth daily.    Marland Kitchen gabapentin (NEURONTIN) 300 MG capsule Take 1 capsule (300 mg total) by mouth at bedtime. (Patient not taking: Reported on 06/27/2017) 30 capsule 11  . meloxicam (MOBIC) 15 MG tablet Take 15 mg by mouth daily.    Marland Kitchen ALPRAZolam (XANAX) 0.5 MG tablet Take 1 tablet up to one hour prior to MRI for anxiety, may repeat x 1 if needed. (Patient not taking: Reported on 04/15/2017) 2 tablet 0  . aspirin 325 MG tablet Take 650 mg by mouth once.    . DULoxetine (CYMBALTA) 30 MG capsule Take 30 mg by mouth at bedtime.     Facility-Administered Medications Prior to Visit  Medication Dose Route Frequency Provider Last Rate Last Dose  . 0.9 %  sodium chloride infusion  500 mL Intravenous Continuous Rachael Fee, MD      . gadopentetate dimeglumine (MAGNEVIST) injection 15 mL  15 mL Intravenous Once PRN Anson Fret, MD        PAST MEDICAL HISTORY: Past Medical History:  Diagnosis Date  . Elevated LDL cholesterol level   . Essential hypertension   . Hyperlipidemia   . Primary insomnia   . Tinnitus     PAST SURGICAL HISTORY: Past Surgical History:  Procedure Laterality Date  . TONSILLECTOMY  1960s  . TYMPANOPLASTY  1978    FAMILY HISTORY: Family History  Problem Relation Age of Onset  . Diabetes Mother   . Other Sister        Spinal tumor  . Other Father        Sepsis  . Parkinson's disease Paternal Grandmother   . Other Maternal Aunt        Brain lesion  . Colon cancer Neg Hx   . Neuromuscular disorder Neg Hx     SOCIAL HISTORY: Social History   Social History  . Marital status: Single    Spouse name: N/A  . Number of children: 0  . Years of education: College   Occupational History  . [employer]    Social History Main Topics  . Smoking status: Never Smoker  . Smokeless tobacco: Former Neurosurgeon    Types:  Chew    Quit date: 73  . Alcohol use Yes     Comment: occassional  . Drug use: No  . Sexual activity: Not on file   Other Topics Concern  . Not on file   Social History Narrative   Lives at home w/ his cat   Right-handed   Caffeine: 1-2 coffee per day      PHYSICAL EXAM  Vitals:   06/27/17 1123  BP: 119/81  Pulse: 82  Weight: 222 lb 9.6 oz (101 kg)  Height: 5\' 8"  (1.727 m)   Body mass index is 33.85 kg/m.  Generalized: Well developed, in no acute distress   Neurological examination  Mentation: Alert oriented to time, place, history taking. Follows all commands speech and language fluent Cranial  nerve II-XII: Pupils were equal round reactive to light. Extraocular movements were full, visual field were full on confrontational test. Facial sensation and strength were normal. Uvula tongue midline. Head turning and shoulder shrug  were normal and symmetric. Motor: The motor testing reveals 5 over 5 strength of all 4 extremities. Good symmetric motor tone is noted throughout.  Sensory: Sensory testing is intact to soft touch on all 4 extremities. No evidence of extinction is noted.  Coordination: Cerebellar testing reveals good finger-nose-finger and heel-to-shin bilaterally.  Gait and station: Gait is normal. Tandem gait is normal. Romberg is negative. No drift is seen.  Reflexes: Deep tendon reflexes are symmetric and normal bilaterally.   DIAGNOSTIC DATA (LABS, IMAGING, TESTING) - I reviewed patient records, labs, notes, testing and imaging myself where available.  Lab Results  Component Value Date   WBC 10.0 04/15/2017   HGB 15.6 04/15/2017   HCT 44.3 04/15/2017   MCV 89.1 04/15/2017   PLT 355 04/15/2017      Component Value Date/Time   NA 132 (L) 04/15/2017 2143   K 3.5 04/15/2017 2143   CL 98 (L) 04/15/2017 2143   CO2 24 04/15/2017 2143   GLUCOSE 97 04/15/2017 2143   BUN 12 04/15/2017 2143   CREATININE 0.84 04/15/2017 2143   CALCIUM 9.4 04/15/2017 2143    PROT 7.5 04/15/2017 2143   PROT 6.9 03/03/2017 0943   ALBUMIN 4.4 04/15/2017 2143   ALBUMIN 4.7 03/03/2017 0943   AST 17 04/15/2017 2143   ALT 21 04/15/2017 2143   ALKPHOS 75 04/15/2017 2143   BILITOT 1.0 04/15/2017 2143   BILITOT 0.5 03/03/2017 0943   GFRNONAA >60 04/15/2017 2143   GFRAA >60 04/15/2017 2143   Lab Results  Component Value Date   VITAMINB12 383 03/03/2017   Lab Results  Component Value Date   TSH 1.73 02/28/2017      ASSESSMENT AND PLAN 56 y.o. year old male  has a past medical history of Elevated LDL cholesterol level; Essential hypertension; Hyperlipidemia; Primary insomnia; and Tinnitus. here with:   1. Discomfort in the lower extremities 2. Vitamin D deficiency  Patient continues to have discomfort in the lower extremity. His symptoms are not consistent with restless leg symptoms. The patient has had a thorough workup with all test being relatively unremarkable. He did have a low vitamin D level and has been advised to take vitamin D supplements. We will try a low-dose muscle relaxer at bedtime to see if this offers any benefit- Flexeril 5 mg at bedtime We will also refer to physical therapy to see if streching of the muscles in the right leg offers any benefit. Patient is amenable to this plan. I have reviewed the side effects of this medication with the patient and provided him with a handout. He is advised that if his symptoms worsen or he develops new symptoms he should let us know. He will follow-up in 6 months or sooner if needed.  I discussed plan of care with Dr. Lucia Gaskins. She is in agreement with starting a muscle relaxer and physical therapy.    Butch Penny, MSN, NP-C 06/27/2017, 11:42 AM [facility] [address], [address] [place], [place] [address] [phone]

## 2017-06-27 NOTE — Patient Instructions (Addendum)
Your Plan:  Try Flexeril 5 mg at bedtime if needed for tightening sensations Referral to Physical therapy If your symptoms worsen or you develop new symptoms please let us know.    Thank you for coming to see [place] at [facility]. I hope we have been able to provide you high quality care today.  You may receive a patient satisfaction survey over the next few weeks. We would appreciate your feedback and comments so that we may continue to improve ourselves and the health of our patients..  Cyclobenzaprine tablets What is this medicine? CYCLOBENZAPRINE (sye kloe BEN za preen) is a muscle relaxer. It is used to treat muscle pain, spasms, and stiffness. This medicine may be used for other purposes; ask your health care provider or pharmacist if you have questions. COMMON BRAND NAME(S): Fexmid, Flexeril What should I tell my health care provider before I take this medicine? They need to know if you have any of these conditions: -heart disease, irregular heartbeat, or previous heart attack -liver disease -thyroid problem -an unusual or allergic reaction to cyclobenzaprine, tricyclic antidepressants, lactose, other medicines, foods, dyes, or preservatives -pregnant or trying to get pregnant -breast-feeding How should I use this medicine? Take this medicine by mouth with a glass of water. Follow the directions on the prescription label. If this medicine upsets your stomach, take it with food or milk. Take your medicine at regular intervals. Do not take it more often than directed. Talk to your pediatrician regarding the use of this medicine in children. Special care may be needed. Overdosage: If you think you have taken too much of this medicine contact a poison control center or emergency room at once. NOTE: This medicine is only for you. Do not share this medicine with others. What if I miss a dose? If you miss a dose, take it as soon as you can. If it is almost time for your  next dose, take only that dose. Do not take double or extra doses. What may interact with this medicine? Do not take this medicine with any of the following medications: -certain medicines for fungal infections like fluconazole, itraconazole, ketoconazole, posaconazole, voriconazole -cisapride -dofetilide -dronedarone -halofantrine -levomethadyl -MAOIs like Carbex, Eldepryl, Marplan, Nardil, and Parnate -narcotic medicines for cough -pimozide -thioridazine -ziprasidone This medicine may also interact with the following medications: -alcohol -antihistamines for allergy, cough and cold -certain medicines for anxiety or sleep -certain medicines for cancer -certain medicines for depression like amitriptyline, fluoxetine, sertraline -certain medicines for infection like alfuzosin, chloroquine, clarithromycin, levofloxacin, mefloquine, pentamidine, troleandomycin -certain medicines for irregular heart beat -certain medicines for seizures like phenobarbital, primidone -contrast dyes -general anesthetics like halothane, isoflurane, methoxyflurane, propofol -local anesthetics like lidocaine, pramoxine, tetracaine -medicines that relax muscles for surgery -narcotic medicines for pain -other medicines that prolong the QT interval (cause an abnormal heart rhythm) -phenothiazines like chlorpromazine, mesoridazine, prochlorperazine This list may not describe all possible interactions. Give your health care provider a list of all the medicines, herbs, non-prescription drugs, or dietary supplements you use. Also tell them if you smoke, drink alcohol, or use illegal drugs. Some items may interact with your medicine. What should I watch for while using this medicine? Tell your doctor or health care professional if your symptoms do not start to get better or if they get worse. You may get drowsy or dizzy. Do not drive, use machinery, or do anything that needs mental alertness until you know how this  medicine affects you. Do not stand or sit  up quickly, especially if you are an older patient. This reduces the risk of dizzy or fainting spells. Alcohol may interfere with the effect of this medicine. Avoid alcoholic drinks. If you are taking another medicine that also causes drowsiness, you may have more side effects. Give your health care provider a list of all medicines you use. Your doctor will tell you how much medicine to take. Do not take more medicine than directed. Call emergency for help if you have problems breathing or unusual sleepiness. Your mouth may get dry. Chewing sugarless gum or sucking hard candy, and drinking plenty of water may help. Contact your doctor if the problem does not go away or is severe. What side effects may I notice from receiving this medicine? Side effects that you should report to your doctor or health care professional as soon as possible: -allergic reactions like skin rash, itching or hives, swelling of the face, lips, or tongue -breathing problems -chest pain -fast, irregular heartbeat -hallucinations -seizures -unusually weak or tired Side effects that usually do not require medical attention (report to your doctor or health care professional if they continue or are bothersome): -headache -nausea, vomiting This list may not describe all possible side effects. Call your doctor for medical advice about side effects. You may report side effects to FDA at 1-800-FDA-1088. Where should I keep my medicine? Keep out of the reach of children. Store at room temperature between 15 and 30 degrees C (59 and 86 degrees F). Keep container tightly closed. Throw away any unused medicine after the expiration date. NOTE: This sheet is a summary. It may not cover all possible information. If you have questions about this medicine, talk to your doctor, pharmacist, or health care provider.  2018 Elsevier/Gold Standard (2015-07-28 12:05:46)

## 2017-06-28 ENCOUNTER — Encounter: Payer: Self-pay | Admitting: Adult Health

## 2017-06-28 ENCOUNTER — Encounter: Payer: Self-pay | Admitting: Physician Assistant

## 2017-06-29 ENCOUNTER — Encounter: Payer: Self-pay | Admitting: Physician Assistant

## 2017-06-29 ENCOUNTER — Ambulatory Visit (INDEPENDENT_AMBULATORY_CARE_PROVIDER_SITE_OTHER): Payer: BLUE CROSS/BLUE SHIELD | Admitting: Physician Assistant

## 2017-06-29 VITALS — BP 108/70 | HR 66 | Ht 68.0 in | Wt 219.5 lb

## 2017-06-29 DIAGNOSIS — K219 Gastro-esophageal reflux disease without esophagitis: Secondary | ICD-10-CM

## 2017-06-29 DIAGNOSIS — R1013 Epigastric pain: Secondary | ICD-10-CM

## 2017-06-29 MED ORDER — OMEPRAZOLE 40 MG PO CPDR: 40.0000 mg | DELAYED_RELEASE_CAPSULE | Freq: Every day | ORAL | 3 refills | Status: AC

## 2017-06-29 NOTE — Patient Instructions (Signed)
We have sent the following medications to your pharmacy for you to pick up at your convenience:   Omeprazole 40 mg 30 mins before dinner.

## 2017-06-29 NOTE — Progress Notes (Signed)
Chief Complaint: Abdominal pain  HPI:  Herbert Cantu is a 56 year old Caucasian male who follows with Dr. Christella Hartigan and presents to clinic today with continued abdominal pain.   Please recall patient was initially seen by Mike Gip, PA-C on 02/28/17 for complaint of loss of appetite and weight loss. He also described many other vague complaints that day including leg weakness and generalized abdominal pain. He had labs including a CBC, sedimentation rate, CRP, TSH, ANA and a CT of the abdomen and pelvis with contrast. All labs were normal. CT showed no acute abnormality. 3 mm nonobstructing right renal calculus. Moderately enlarged prostate gland. 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 Patient was arranged for an EGD.    EGD 04/06/17 showed moderate gastritis. Biopsies negative for H. pylori. Patient was prescribed Omeprazole 40 mg once daily.    Today, the patient returns to clinic and continues to describe what he calls a generalized abdominal pain which seems to radiate up into his chest. This is most often worse when he lays down at night. Sometimes he will just "rub all over my stomach" and this makes it feel some better. Patient has noticed a corresponding decrease in appetite over the past 5 months and his pain is somewhat worse after eating. He describes it as a "dull ache", he rates it as a 5-6/10. This can often lasts several hours when it starts and will come and go throughout the night. Along with this the patient tells me that he has been experiencing some restless leg syndrome which is being worked up by neurology. Apparently this keeps him awake all night long. Patient tells me that he will wander throughout the house and drink lots of water and even some chocolate chip cookies at times as well as other snacks. Patient denies ever having used Omeprazole 40 mg   Patient denies fever, chills, blood in the stool, melena, change in bowel habits, nausea or vomiting.  Past Medical  History:  Diagnosis Date  . Elevated LDL cholesterol level   . Essential hypertension   . Hyperlipidemia   . Primary insomnia   . Tinnitus     Past Surgical History:  Procedure Laterality Date  . TONSILLECTOMY  1960s  . TYMPANOPLASTY  1978    Current Outpatient Prescriptions  Medication Sig Dispense Refill  . acetaminophen (TYLENOL) 500 MG tablet Take 500 mg by mouth every 6 (six) hours as needed.    Marland Kitchen aspirin EC 81 MG tablet Take 81 mg by mouth at bedtime.    . Cholecalciferol (D3 VITAMIN PO) Take by mouth. daily    . Cyanocobalamin (VITAMIN B-12) 5000 MCG SUBL Place under the tongue daily.    . cyclobenzaprine (FLEXERIL) 5 MG tablet Take 1 tablet (5 mg total) by mouth at bedtime as needed for muscle spasms. 30 tablet 0  . gabapentin (NEURONTIN) 300 MG capsule Take 1 capsule (300 mg total) by mouth at bedtime. 30 capsule 11  . ibuprofen (ADVIL,MOTRIN) 200 MG tablet Take 200 mg by mouth every 6 (six) hours as needed.    Marland Kitchen LORazepam (ATIVAN) 1 MG tablet Take 1 mg by mouth at bedtime.     Marland Kitchen losartan (COZAAR) 50 MG tablet Take 50 mg by mouth daily.    . Magnesium 500 MG TABS Take by mouth. One tablet qhs    . Multiple Vitamin (MULTIVITAMIN) tablet Take 1 tablet by mouth daily.    . vitamin C (ASCORBIC ACID) 500 MG tablet Take 500 mg  by mouth daily as needed.    Marland Kitchen omeprazole (PRILOSEC) 40 MG capsule Take 1 capsule (40 mg total) by mouth daily. 30 capsule 3   Current Facility-Administered Medications  Medication Dose Route Frequency Provider Last Rate Last Dose  . 0.9 %  sodium chloride infusion  500 mL Intravenous Continuous Rachael Fee, MD       Facility-Administered Medications Ordered in Other Visits  Medication Dose Route Frequency Provider Last Rate Last Dose  . gadopentetate dimeglumine (MAGNEVIST) injection 15 mL  15 mL Intravenous Once PRN Anson Fret, MD        Allergies as of 06/29/2017 - Review Complete 06/27/2017  Allergen Reaction Noted  . Cymbalta  [duloxetine hcl]  06/27/2017    Family History  Problem Relation Age of Onset  . Diabetes Mother   . Other Sister        Spinal tumor  . Other Father        Sepsis  . Parkinson's disease Paternal Grandmother   . Other Maternal Aunt        Brain lesion  . Colon cancer Neg Hx   . Neuromuscular disorder Neg Hx     Social History   Social History  . Marital status: Single    Spouse name: N/A  . Number of children: 0  . Years of education: College   Occupational History  . [employer]    Social History Main Topics  . Smoking status: Never Smoker  . Smokeless tobacco: Former Neurosurgeon    Types: Chew    Quit date: 26  . Alcohol use Yes     Comment: occassional  . Drug use: No  . Sexual activity: Not on file   Other Topics Concern  . Not on file   Social History Narrative   Lives at home w/ his cat   Right-handed   Caffeine: 1-2 coffee per day    Review of Systems:    Constitutional: No fever or chills Cardiovascular: No chest pain Respiratory: No SOB  Gastrointestinal: See HPI and otherwise negative   Physical Exam:  Vital signs: BP 108/70   Pulse 66   Ht 5\' 8"  (1.727 m)   Wt 219 lb 8 oz (99.6 kg)   BMI 33.37 kg/m   Constitutional:   Pleasant Caucasian male appears to be in NAD, Well developed, Well nourished, cooperative, tired with multiple yawns and drooping eyelids Respiratory: Respirations even and unlabored. Lungs clear to auscultation bilaterally.   No wheezes, crackles, or rhonchi.  Cardiovascular: Normal S1, S2. No MRG. Regular rate and rhythm. No peripheral edema, cyanosis or pallor.  Gastrointestinal:  Soft, nondistended, mild epigastric ttp. No rebound or guarding. Normal bowel sounds. No appreciable masses or hepatomegaly. Psychiatric:  Demonstrates good judgement and reason without abnormal affect or behaviors.  MOST RECENT LABS AND IMAGING: CBC    Component Value Date/Time   WBC 10.0 04/15/2017 2143   RBC 4.97 04/15/2017 2143   HGB  15.6 04/15/2017 2143   HCT 44.3 04/15/2017 2143   PLT 355 04/15/2017 2143   MCV 89.1 04/15/2017 2143   MCH 31.4 04/15/2017 2143   MCHC 35.2 04/15/2017 2143   RDW 12.6 04/15/2017 2143   LYMPHSABS 2.2 04/15/2017 2143   MONOABS 0.7 04/15/2017 2143   EOSABS 0.3 04/15/2017 2143   BASOSABS 0.0 04/15/2017 2143    CMP     Component Value Date/Time   NA 132 (L) 04/15/2017 2143   K 3.5 04/15/2017 2143   CL 98 (  L) 04/15/2017 2143   CO2 24 04/15/2017 2143   GLUCOSE 97 04/15/2017 2143   BUN 12 04/15/2017 2143   CREATININE 0.84 04/15/2017 2143   CALCIUM 9.4 04/15/2017 2143   PROT 7.5 04/15/2017 2143   PROT 6.9 03/03/2017 0943   ALBUMIN 4.4 04/15/2017 2143   ALBUMIN 4.7 03/03/2017 0943   AST 17 04/15/2017 2143   ALT 21 04/15/2017 2143   ALKPHOS 75 04/15/2017 2143   BILITOT 1.0 04/15/2017 2143   BILITOT 0.5 03/03/2017 0943   GFRNONAA >60 04/15/2017 2143   GFRAA >60 04/15/2017 2143   EXAM: CT ABDOMEN AND PELVIS WITH CONTRAST 03/07/17  TECHNIQUE: Multidetector CT imaging of the abdomen and pelvis was performed using the standard protocol following bolus administration of intravenous contrast.  CONTRAST:  100mL ISOVUE-300 IOPAMIDOL (ISOVUE-300) INJECTION 61%  COMPARISON:  Lumbar spine radiographs dated 12/09/2016. Chest radiographs dated 12/25/2016.  FINDINGS: Lower chest: Unremarkable.  Hepatobiliary: No focal liver abnormality is seen. No gallstones, gallbladder wall thickening, or biliary dilatation.  Pancreas: Unremarkable. No pancreatic ductal dilatation or surrounding inflammatory changes.  Spleen: Unremarkable.  Adrenals/Urinary Tract: 3 mm mid to upper right renal calculus. Unremarkable left kidney, ureters, urinary bladder and adrenal glands.  Stomach/Bowel: Stomach is within normal limits. Appendix appears normal. No evidence of bowel wall thickening, distention, or inflammatory changes.  Vascular/Lymphatic: No significant vascular findings are  present. No enlarged abdominal or pelvic lymph nodes.  Reproductive: Moderately enlarged prostate gland.  Other: Moderate-sized right inguinal hernia containing fat. Small umbilical hernia containing fat.  Musculoskeletal: Lumbar and lower thoracic spine degenerative changes. 2.0 x 1.3 cm oval, circumscribed lesion with peripheral calcification and central low density in the right iliac bone on coronal image number 78 of series 6. Minimal bilateral hip degenerative changes. Multiple small bilateral femoral head and neck junctions cysts.  IMPRESSION: 1. No acute abnormality. 2. 3 mm nonobstructing right renal calculus. 3. Moderately enlarged prostate gland. 4. 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   Electronically Signed   By: Beckie SaltsSteven  Reid M.D.   On: 03/07/2017 16:28  Assessment: 1. Epigastric abdominal pain: Likely related to moderate gastritis seen at time of recent EGD, patient has not been on medication as prescribed 2. GERD: Patient describes reflux symptoms throughout the day, worse at night; likely related to moderate gastritis seen on EGD  Plan: 1. Reviewed antireflux diet and lifestyle modifications with the patient today. Provided him with a handout. Reiterated that he should try not to eat for 3-4 hours before going to bed and should avoid late night snacks. 2. Prescribed Omeprazole 40 mg once daily, 30-60 minutes before dinner as patient has most of his symptoms at night 3. Patient to follow in clinic in 2-3 months   Hyacinth MeekerJennifer Tyleah Loh, PA-C [facility] 06/29/2017, 3:15 PM  Cc: Tally JoeSwayne, David, MD

## 2017-06-30 NOTE — Progress Notes (Signed)
I agree with the above note, plan 

## 2017-07-06 ENCOUNTER — Encounter: Payer: Self-pay | Admitting: Adult Health

## 2017-07-08 NOTE — Progress Notes (Signed)
Personally  participated in, made any corrections needed, and agree with history, physical, neuro exam,assessment and plan as stated.     Boykin Baetz, MD [facility]     

## 2017-07-11 NOTE — Telephone Encounter (Signed)
I suggest that he wait till he [facility]. Taking flexeril just to nap during the day is not ideal.

## 2017-07-24 ENCOUNTER — Encounter: Payer: Self-pay | Admitting: Neurology

## 2017-07-27 ENCOUNTER — Encounter: Payer: Self-pay | Admitting: Physician Assistant

## 2017-07-27 ENCOUNTER — Encounter: Payer: Self-pay | Admitting: Neurology

## 2017-07-28 ENCOUNTER — Other Ambulatory Visit: Payer: Self-pay | Admitting: Neurology

## 2017-07-28 DIAGNOSIS — M5412 Radiculopathy, cervical region: Secondary | ICD-10-CM

## 2017-08-01 ENCOUNTER — Ambulatory Visit: Payer: BLUE CROSS/BLUE SHIELD | Attending: Adult Health | Admitting: Physical Therapy

## 2017-08-01 DIAGNOSIS — M79604 Pain in right leg: Secondary | ICD-10-CM | POA: Insufficient documentation

## 2017-08-02 NOTE — Therapy (Signed)
[facility] [address], [address] [place], [place], [address] Phone: [phone]   Fax:  [phone]  Physical Therapy Evaluation/(one time visit)  Patient Details  Name: Lonzie Simmer MRN: [id number] Date of Birth: 20-Nov-1960 Referring Provider: Butch Penny, NP  Encounter Date: 08/01/2017      PT End of Session - 08/02/17 0742    Visit Number 1   Number of Visits 1   Authorization Type [employer]   Authorization Time Period 08/01/17 to 08/02/17   PT Start Time 1446   PT Stop Time 1529   PT Time Calculation (min) 43 min   Activity Tolerance No increased pain;Patient tolerated treatment well   Behavior During Therapy [facility] for tasks assessed/performed      Past Medical History:  Diagnosis Date  . Elevated LDL cholesterol level   . Essential hypertension   . Hyperlipidemia   . Primary insomnia   . Tinnitus     Past Surgical History:  Procedure Laterality Date  . TONSILLECTOMY  1960s  . TYMPANOPLASTY  1978    There were no vitals filed for this visit.       Subjective Assessment - 08/01/17 1449    Subjective Pt reports a recent history of LBP onset earlier this year. He had to go to the ED on day for dizziness and has had alot of testing done since then. He just doesn't feel good. He noticed that his legs are more restless than anything, and he notices this at night. Pt takes the lorazepam daily to help out with the sleeping and    Patient Stated Goals figure out if there is anything that can relieve his issues.    Currently in Pain? No/denies  not pain, just "weak" in the knees and shins    Pain Location Knee   Pain Orientation Right;Left   Pain Descriptors / Indicators Other (Comment)  "weakness"   Pain Radiating Towards none    Pain Onset More than a month ago   Pain Frequency Intermittent   Aggravating Factors  notices the restless legs mostly at night    Pain Relieving Factors unsure            [facility]  PT Assessment - 08/02/17 0001      Assessment   Medical Diagnosis Pain in Rt leg    Referring Provider Butch Penny, NP   Next MD Visit PCP this week    Prior Therapy none      Balance Screen   Has the patient fallen in the past 6 months No   Has the patient had a decrease in activity level because of a fear of falling?  No   Is the patient reluctant to leave their home because of a fear of falling?  No     Home [employer]ist information centre manager residence     Prior Function   Level of Independence Independent     Cognition   Overall Cognitive Status No family/caregiver present to determine baseline cognitive functioning     Observation/Other Assessments   Focus on Therapeutic Outcomes (FOTO)  27% limited     Sensation   Light Touch Appears Intact     Posture/Postural Control   Posture/Postural Control No significant limitations     AROM   Lumbar Flexion WNL, pain free   Lumbar Extension WNL, pain free    Lumbar - Right Side Bend WNL, pain free   Lumbar - Left Side Bend WNL, pain free  Strength   Overall Strength Within functional limits for tasks performed   Overall Strength Comments 5/5 MMT     Flexibility   Soft Tissue Assessment /Muscle Length yes   Hamstrings ~40% limited BLE, no pain noted (90/90) testing   Quadriceps WNL   Piriformis 50% limited      Palpation   Patella mobility WNL   Palpation comment non tender with palpation along anterior knee/quadriceps/low back      Transfers   Comments no difficulties with sit to stand, equal weight shift      Ambulation/Gait   Gait Pattern Within Functional Limits            Objective measurements completed on examination: See above findings.          [facility] Adult PT Treatment/Exercise - 08/02/17 0001      Exercises   Exercises Knee/Hip     Knee/Hip Exercises: Stretches   Passive Hamstring Stretch Both;1 rep;30 seconds   Passive Hamstring Stretch Limitations on step   Piriformis  Stretch 1 rep;30 seconds;Both   Piriformis Stretch Limitations seated      Knee/Hip Exercises: Aerobic   Nustep L6, x6 min                PT Education - 08/01/17 1526    Education provided Yes   Education Details benefits of regular exercise/daily; benefits of completing regular flexibility program to prevent issues down the road; implemented HEP and discussed recommendations for daily exercise working up to atleast 30 min/day   Person(s) Educated Patient   Methods Explanation;Verbal cues;Handout   Comprehension Verbalized understanding;Returned demonstration          PT Short Term Goals - 08/01/17 1525      PT SHORT TERM GOAL #1   Title Pt will demo understanding of the implemented HEP to address noted limitations in flexibility and activity participation to improve his overall mood and quality of life.    Time 1   Period Days   Status Achieved   Target Date 08/01/17                   Plan - 08/02/17 0743    Clinical Impression Statement Pt is a 56 y.o M referred to OPPT with diagnosis of Rt leg pain onset insidiously over the past couple of months. Pt is unable to describe the intensity/frequency and onset of his symptoms, and clarifies that his knee is not painful, rather it just feels "different". This sensation is only noticed at night and despite therapist efforts, was not reproduced during today's sessions with evaluation of the low back and hip/knee complex. Pt demonstrates full BLE strength, pain free lumbar ROM and flexibility limited primarily throughout the hamstrings and piriformis. His symptoms appear to be related more to non-mechanical factors and lack of regular daily exercise at this time. Therapist provided pt with flexibility program and educated him on the benefits of completing a regular exercise program. He is agreeable with this and a one-time visit to allow him to work on these changes on his own.    History and Personal Factors relevant to  plan of care: no significant history prior to recent events    Clinical Presentation Evolving   Clinical Presentation due to: Pt reporting worsening symptoms over the past couple of months    Clinical Decision Making Low   Rehab Potential Fair   Clinical Impairments Affecting Rehab Potential (-) unable to reproduce symptoms   PT Frequency One time visit  PT Treatment/Interventions ADLs/Self Care Home Management;Therapeutic exercise   PT Home Exercise Plan standing hamstring stretch 2x30sec, seated piriformis stretch 2x30sec, walking 10 min/daily and working up to 30+ min    Consulted and Agree with Plan of Care Patient      Patient will benefit from skilled therapeutic intervention in order to improve the following deficits and impairments:  Impaired flexibility  Visit Diagnosis: Pain in right leg     Problem List There are no active problems to display for this patient.  9:19 AM,08/02/17 Marylyn Ishihara PT, DPT [facility]  [phone]  [facility] [address], [address] [place], [place], [address] Phone: [phone]   Fax:  [phone]  Name: Edrei Norgaard MRN: [id number] Date of Birth: 1961-09-06

## 2017-08-21 ENCOUNTER — Encounter: Payer: Self-pay | Admitting: Neurology

## 2017-09-04 ENCOUNTER — Encounter: Payer: Self-pay | Admitting: Neurology

## 2017-09-06 ENCOUNTER — Encounter: Payer: Self-pay | Admitting: Neurology

## 2017-09-12 ENCOUNTER — Ambulatory Visit: Payer: BLUE CROSS/BLUE SHIELD | Admitting: Neurology

## 2017-11-17 ENCOUNTER — Other Ambulatory Visit: Payer: Self-pay | Admitting: Urology

## 2017-11-17 DIAGNOSIS — R972 Elevated prostate specific antigen [PSA]: Secondary | ICD-10-CM

## 2017-12-05 ENCOUNTER — Ambulatory Visit
Admission: RE | Admit: 2017-12-05 | Discharge: 2017-12-05 | Disposition: A | Discharge status: Home / Self Care | Payer: BLUE CROSS/BLUE SHIELD | Source: Ambulatory Visit | Attending: Urology | Admitting: Urology

## 2017-12-05 DIAGNOSIS — R972 Elevated prostate specific antigen [PSA]: Secondary | ICD-10-CM

## 2017-12-05 MED ORDER — GADOBENATE DIMEGLUMINE 529 MG/ML IV SOLN
20.0000 mL | Freq: Once | INTRAVENOUS | Status: AC | PRN
  Administered 2017-12-05: 20 mL via INTRAVENOUS

## 2017-12-28 ENCOUNTER — Ambulatory Visit: Payer: BLUE CROSS/BLUE SHIELD | Admitting: Adult Health

## 2018-02-02 ENCOUNTER — Other Ambulatory Visit: Payer: Self-pay | Admitting: Neurosurgery

## 2018-02-02 DIAGNOSIS — R29898 Other symptoms and signs involving the musculoskeletal system: Secondary | ICD-10-CM

## 2018-02-15 ENCOUNTER — Other Ambulatory Visit: Payer: BLUE CROSS/BLUE SHIELD

## 2018-02-15 ENCOUNTER — Encounter: Payer: Self-pay | Admitting: Neurology

## 2018-02-23 ENCOUNTER — Ambulatory Visit
Admission: RE | Admit: 2018-02-23 | Discharge: 2018-02-23 | Disposition: A | Discharge status: Home / Self Care | Payer: BLUE CROSS/BLUE SHIELD | Source: Ambulatory Visit | Attending: Neurosurgery | Admitting: Neurosurgery

## 2018-02-23 ENCOUNTER — Other Ambulatory Visit: Payer: BLUE CROSS/BLUE SHIELD

## 2018-02-23 DIAGNOSIS — R29898 Other symptoms and signs involving the musculoskeletal system: Secondary | ICD-10-CM

## 2018-02-23 MED ORDER — IOPAMIDOL (ISOVUE-M 300) INJECTION 61%
10.0000 mL | Freq: Once | INTRAMUSCULAR | Status: AC | PRN
  Administered 2018-02-23: 10 mL via INTRATHECAL

## 2018-02-23 MED ORDER — DIAZEPAM 5 MG PO TABS
10.0000 mg | ORAL_TABLET | Freq: Once | ORAL | Status: AC
  Administered 2018-02-23: 10 mg via ORAL

## 2018-02-23 NOTE — Discharge Instructions (Signed)

## 2018-03-01 ENCOUNTER — Encounter: Payer: Self-pay | Admitting: Neurology

## 2018-07-17 IMAGING — CR DG CHEST 2V
2 series · 2 of 2 positions shown · non-contrast
Comparison: 12/14/2016

CLINICAL DATA: Yesterday had a episode of dizziness upon standing
and again this am around 1111. Generalized weakness

EXAM:
CHEST  2 VIEW

[w chest pa]
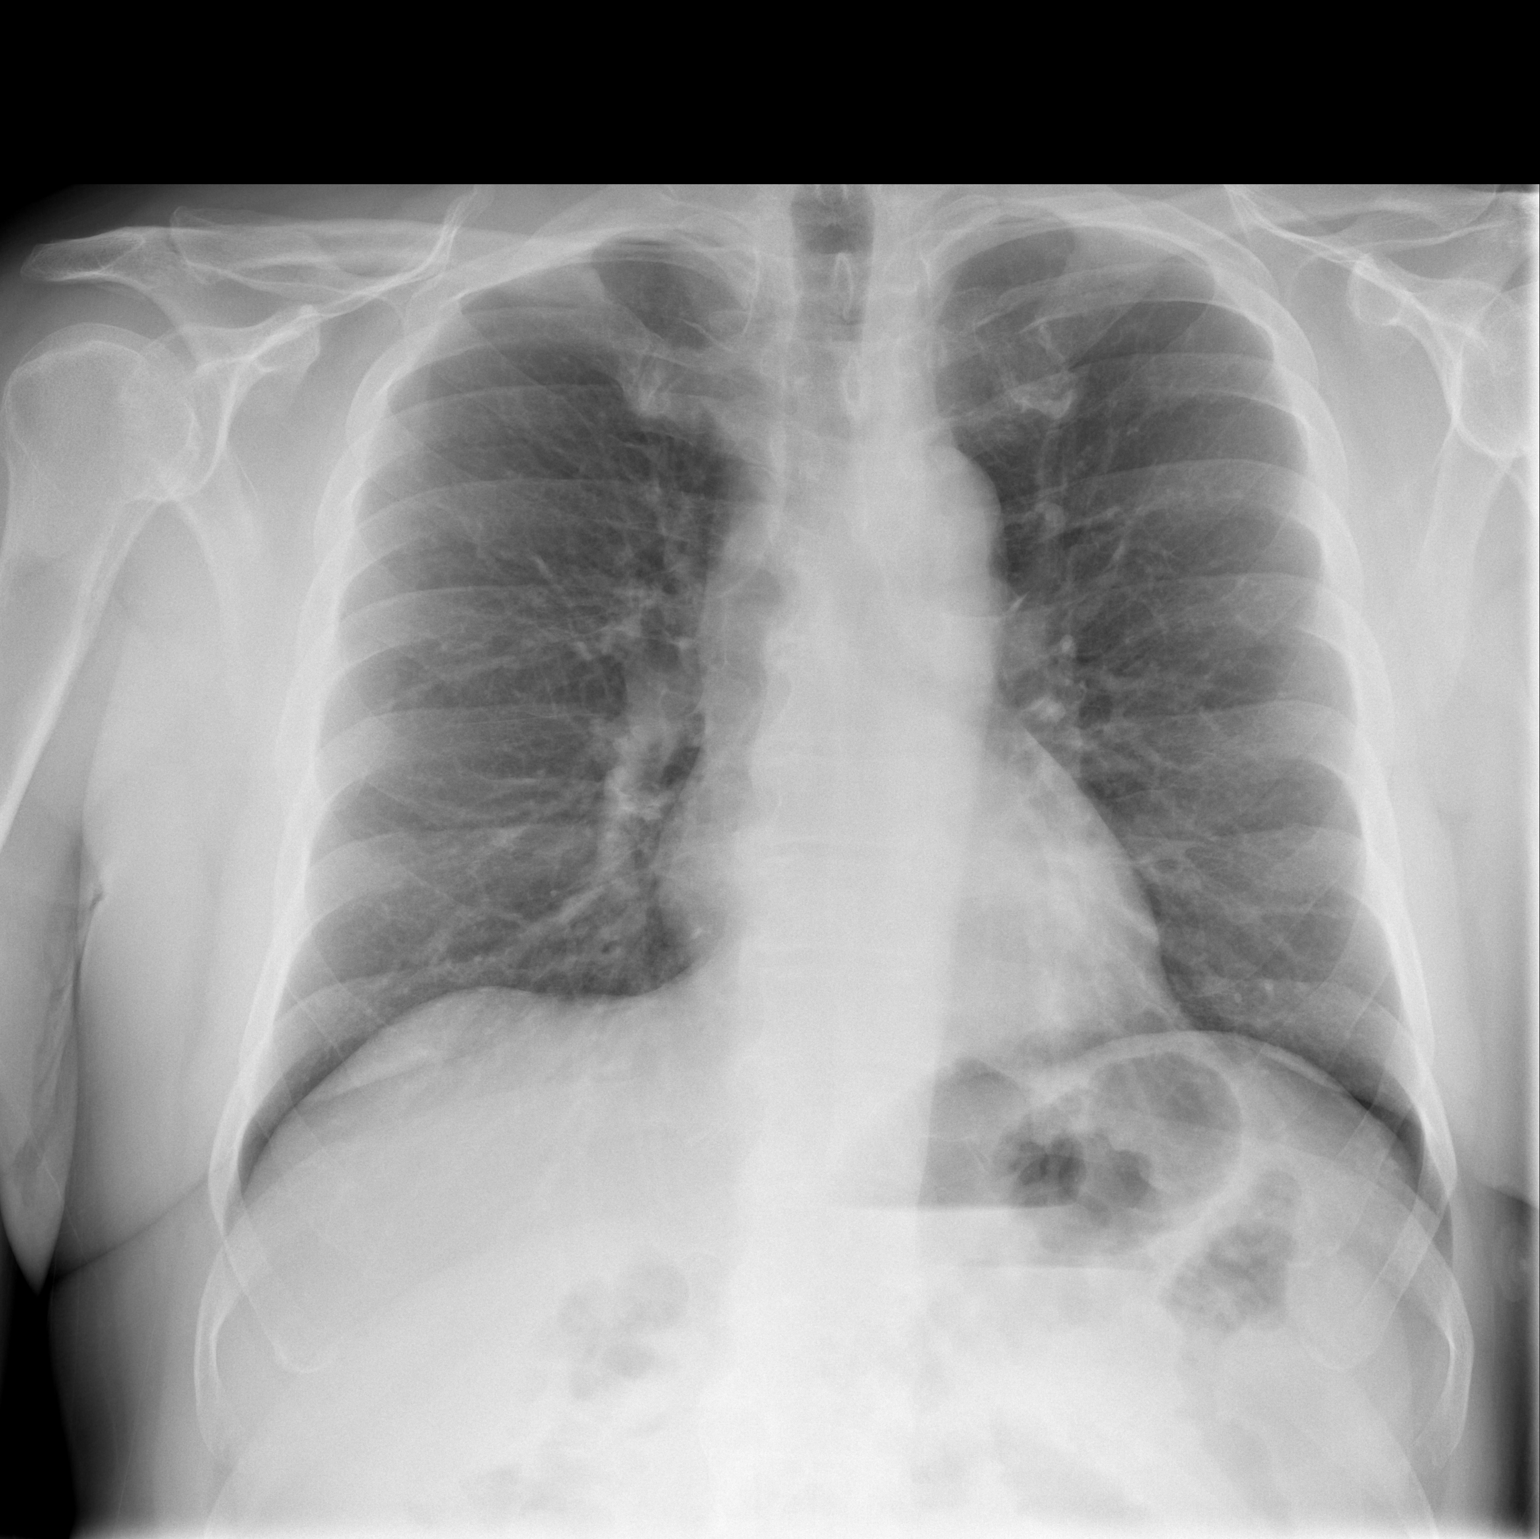

[w chest lat]
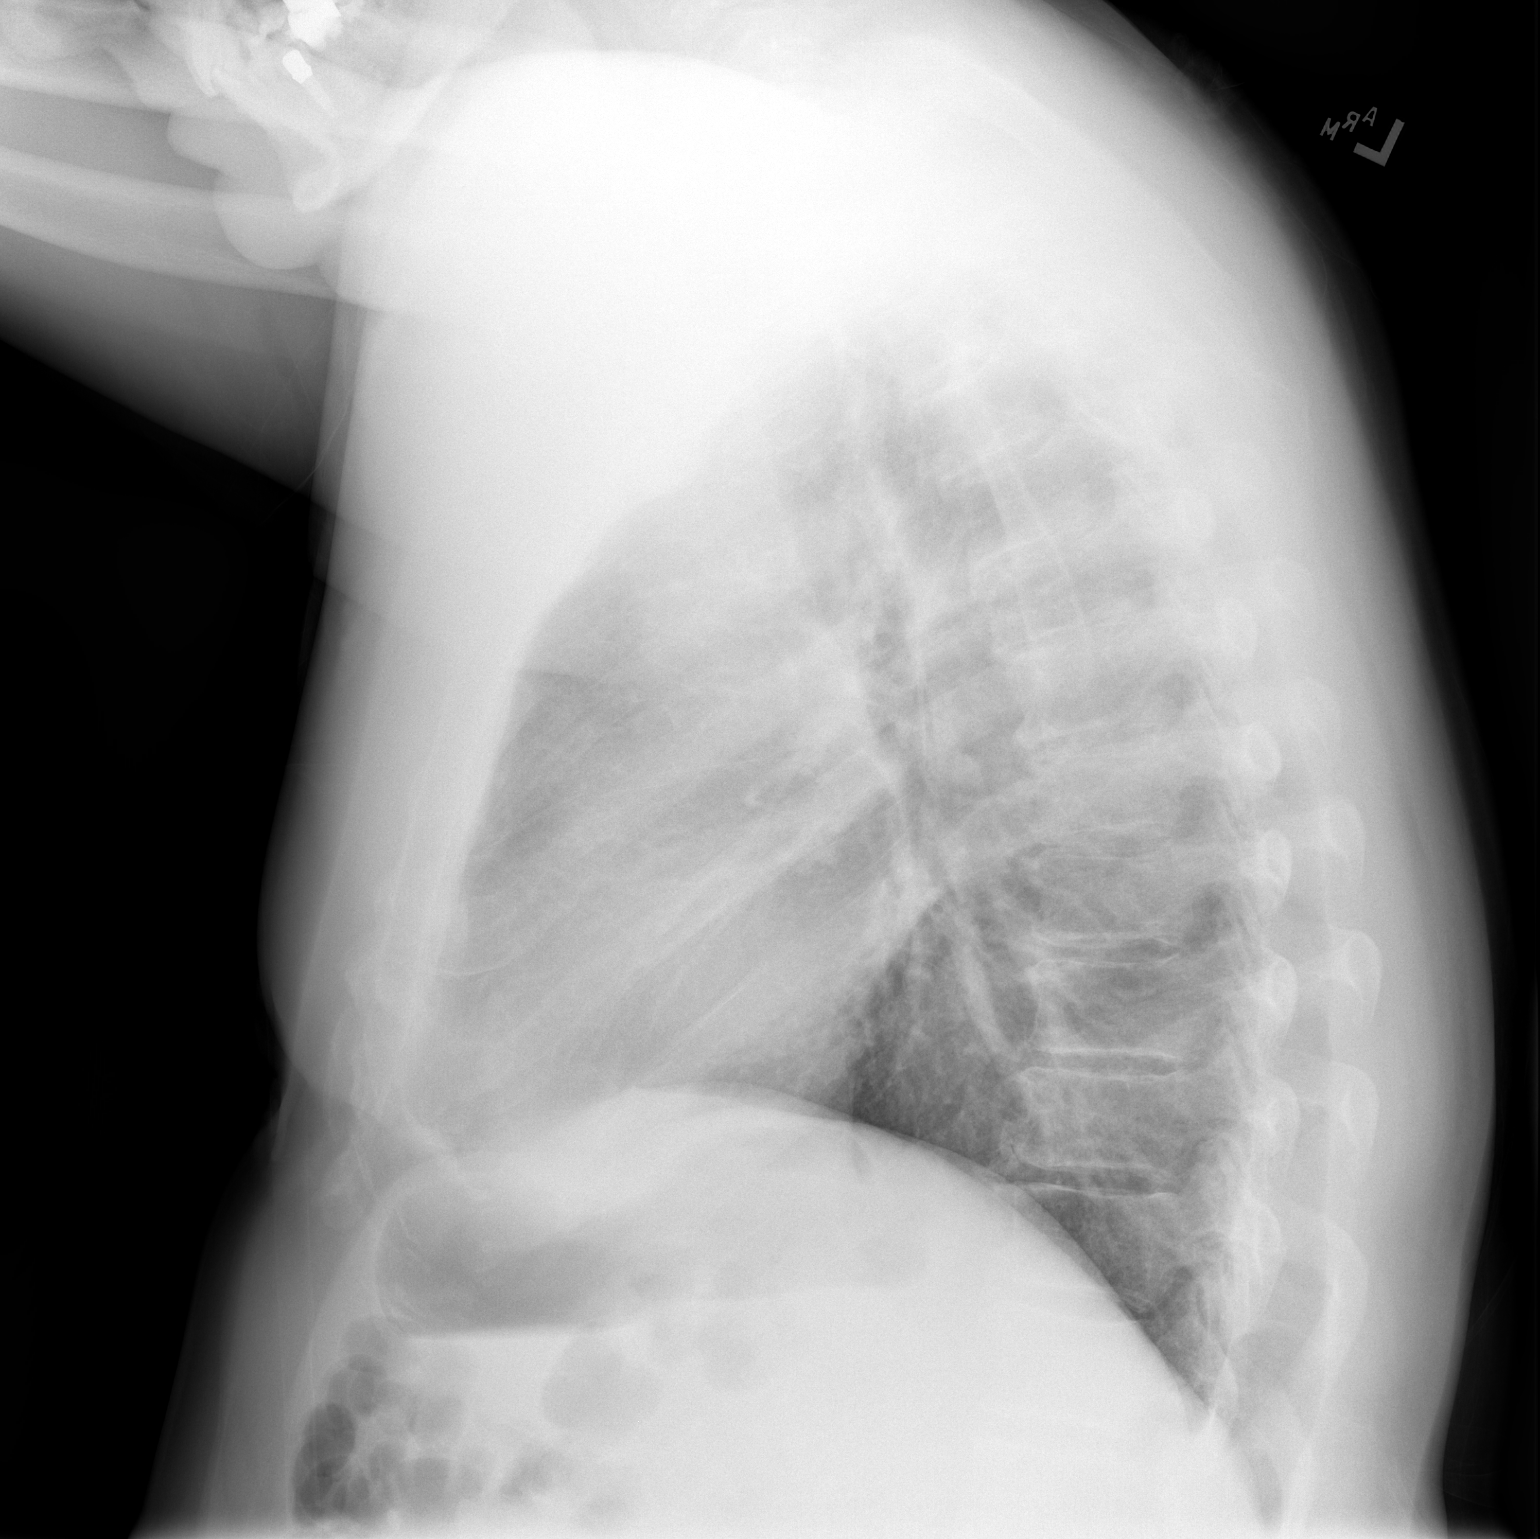

[2 of 2 positions shown; findings below may reference images not displayed]

FINDINGS: Cardiac silhouette is normal in size and configuration. Normal
mediastinal and hilar contours.

Clear lungs.  No pleural effusion or pneumothorax.

Skeletal structures are intact.
IMPRESSION: No active cardiopulmonary disease.

## 2018-09-27 IMAGING — CT CT ABD-PELV W/ CM
2 of 5 series · 16 of 46 positions shown, 18 images · IV contrast (ISOVUE 300)
Comparison: Lumbar spine radiographs dated 12/09/2016. Chest
radiographs dated 12/25/2016.

CLINICAL DATA: Abdominal pain. Epigastric abdominal pain waking the
patient up at night. 20 pound weight loss in the past 2 months.

EXAM:
CT ABDOMEN AND PELVIS WITH CONTRAST
TECHNIQUE: Multidetector CT imaging of the abdomen and pelvis was performed
using the standard protocol following bolus administration of
intravenous contrast.
CONTRAST:  100mL IBYYFC-WYY IOPAMIDOL (IBYYFC-WYY) INJECTION 61%

[Series 2: abd/pel w · axial · 0.83mm/px · z∈[-529,-104]mm · 13 of 97 slices shown, 15 images]
[im 6/97  soft-tissue]
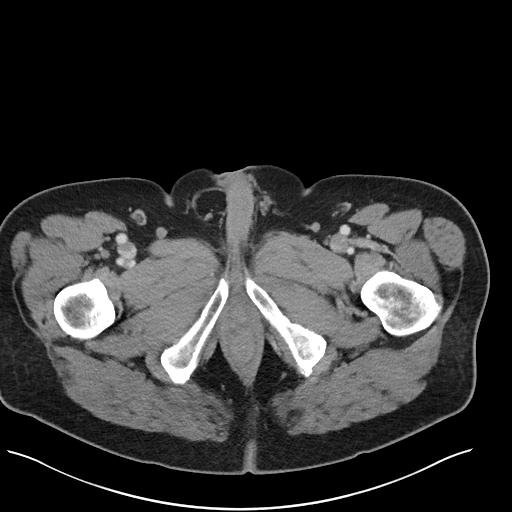
[im 6/97  bone]
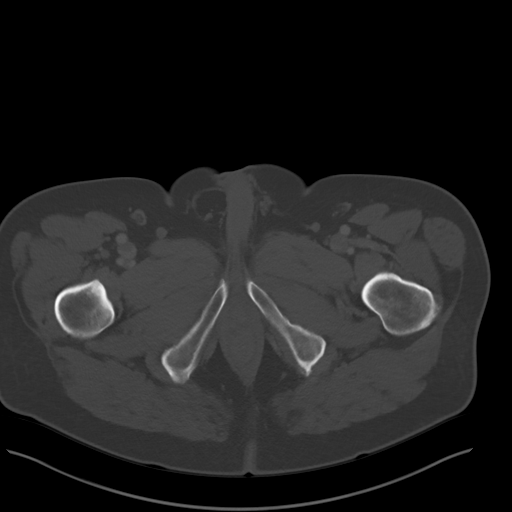
[im 16/97  soft-tissue]
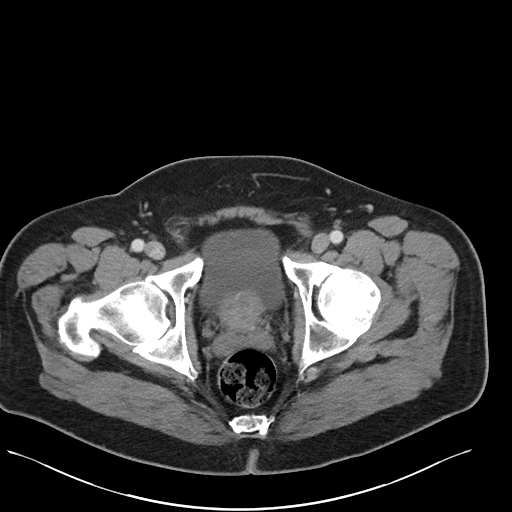
[im 21/97  soft-tissue]
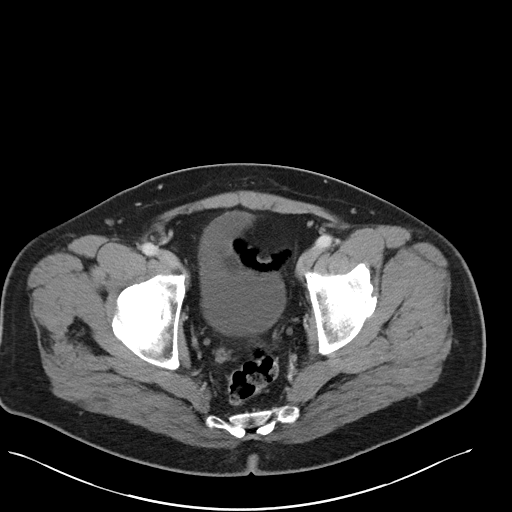
[im 26/97  soft-tissue]
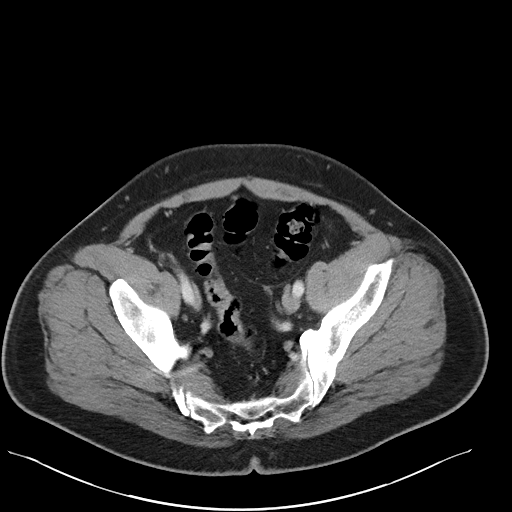
[im 36/97  soft-tissue]
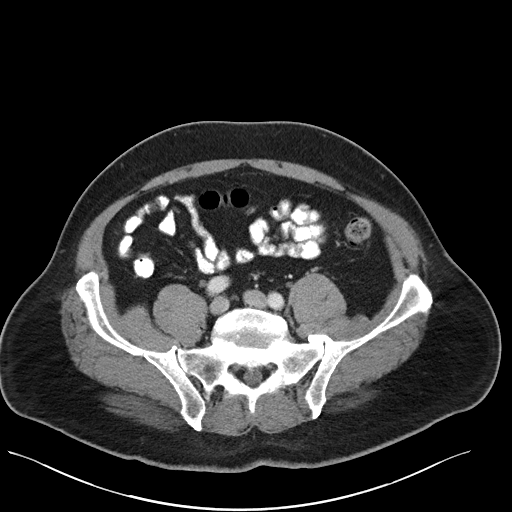
[im 41/97  soft-tissue]
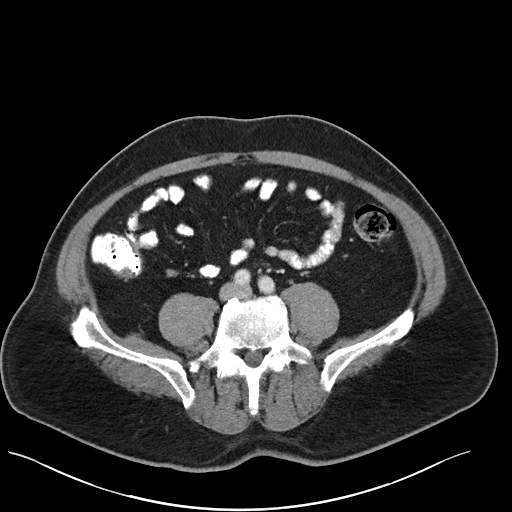
[im 51/97  soft-tissue]
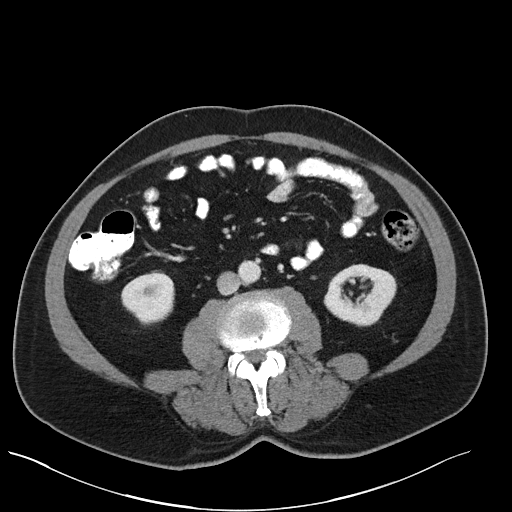
[im 56/97  soft-tissue]
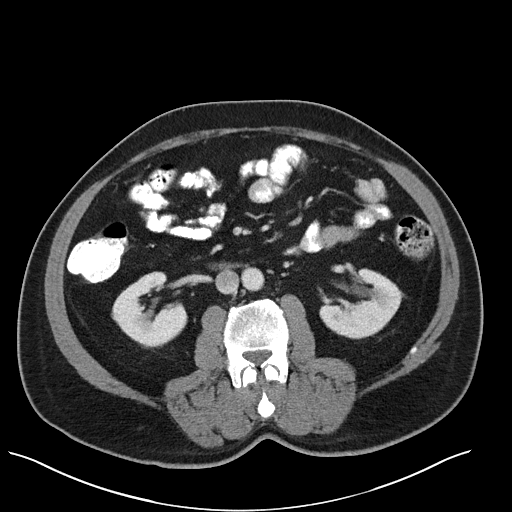
[im 61/97  soft-tissue]
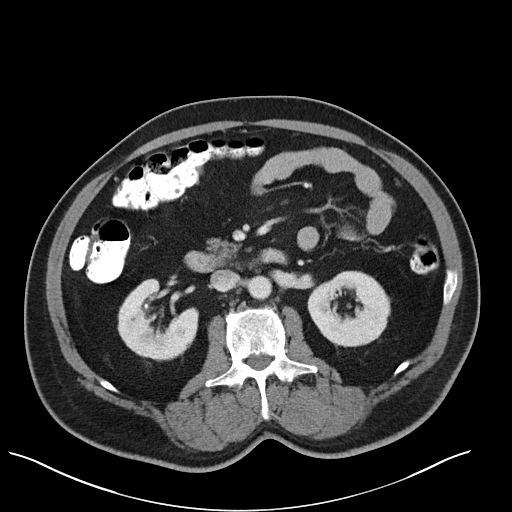
[im 61/97  bone]
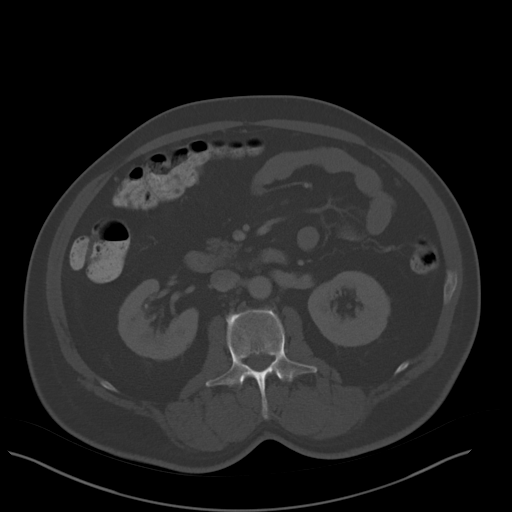
[im 71/97  soft-tissue]
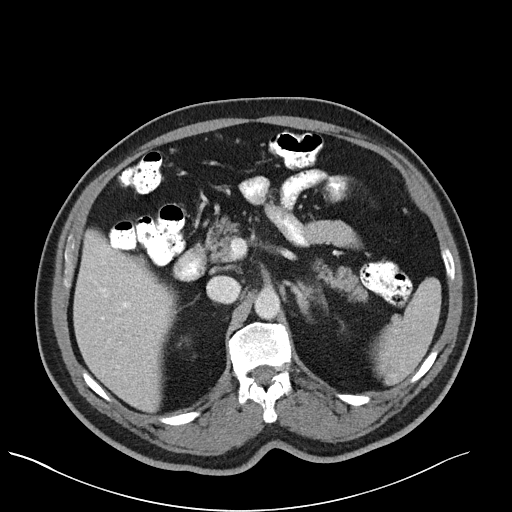
[im 76/97  soft-tissue]
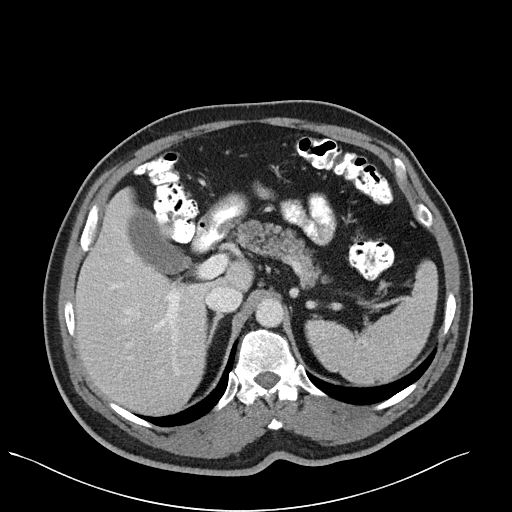
[im 81/97  soft-tissue]
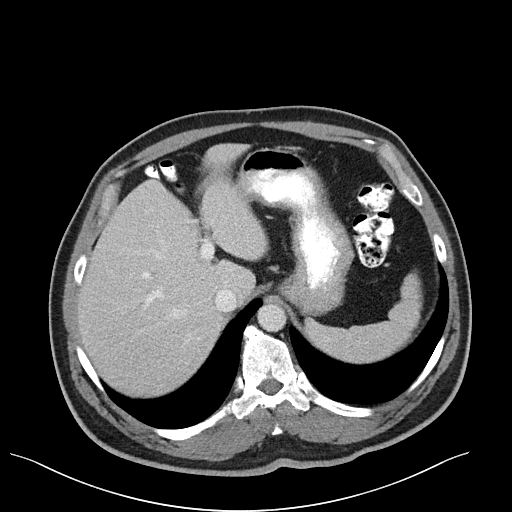
[im 91/97  soft-tissue]
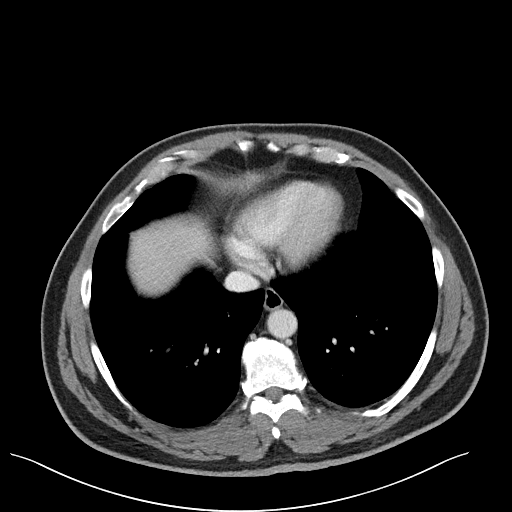

[Series 6: abd/pel w st · coronal · 0.74mm/px · 3 of 94 slices shown]
[im 32/94  soft-tissue]
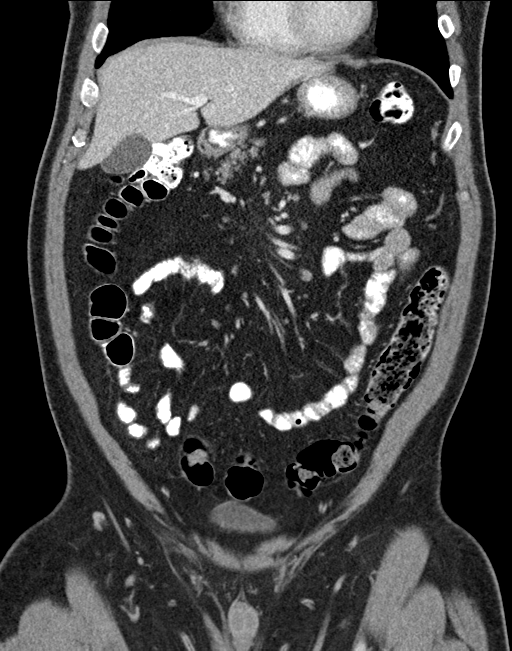
[im 42/94  soft-tissue]
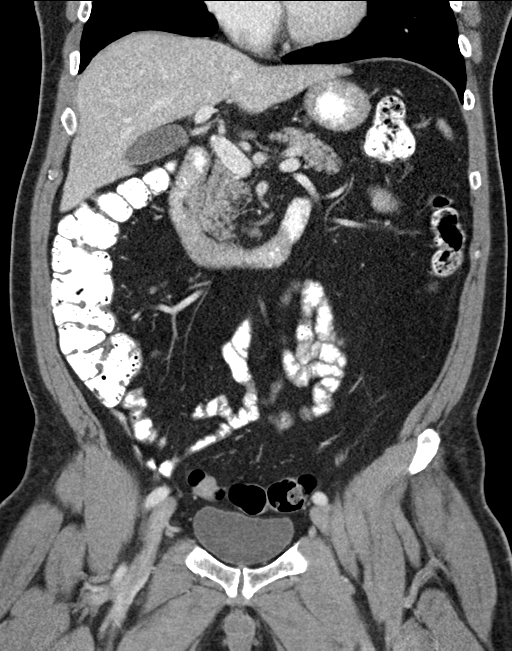
[im 52/94  soft-tissue]
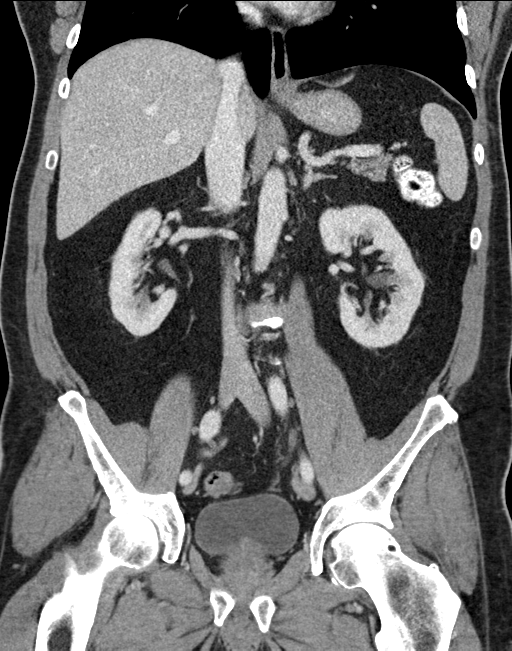

[16 of 46 positions shown; findings below may reference images not displayed]

FINDINGS: Lower chest: Unremarkable.

Hepatobiliary: No focal liver abnormality is seen. No gallstones,
gallbladder wall thickening, or biliary dilatation.

Pancreas: Unremarkable. No pancreatic ductal dilatation or
surrounding inflammatory changes.

Spleen: Unremarkable.

Adrenals/Urinary Tract: 3 mm mid to upper right renal calculus.
Unremarkable left kidney, ureters, urinary bladder and adrenal
glands.

Stomach/Bowel: Stomach is within normal limits. Appendix appears
normal. No evidence of bowel wall thickening, distention, or
inflammatory changes.

Vascular/Lymphatic: No significant vascular findings are present. No
enlarged abdominal or pelvic lymph nodes.

Reproductive: Moderately enlarged prostate gland.

Other: Moderate-sized right inguinal hernia containing fat. Small
umbilical hernia containing fat.

Musculoskeletal: Lumbar and lower thoracic spine degenerative
changes. 2.0 x 1.3 cm oval, circumscribed lesion with peripheral
calcification and central low density in the right iliac bone on
coronal image number 78 of series 6. Minimal bilateral hip
degenerative changes. Multiple small bilateral femoral head and neck
junctions cysts.
IMPRESSION: 1. No acute abnormality.
2. 3 mm nonobstructing right renal calculus.
3. Moderately enlarged prostate gland.
4. 2 cm right iliac bone lesion with nonaggressive features.

## 2021-11-30 ENCOUNTER — Other Ambulatory Visit: Payer: Self-pay | Admitting: Family Medicine

## 2021-11-30 DIAGNOSIS — E041 Nontoxic single thyroid nodule: Secondary | ICD-10-CM

## 2021-12-03 ENCOUNTER — Ambulatory Visit
Admission: RE | Admit: 2021-12-03 | Discharge: 2021-12-03 | Disposition: A | Discharge status: Home / Self Care | Payer: Managed Care, Other (non HMO) | Source: Ambulatory Visit | Attending: Family Medicine | Admitting: Family Medicine

## 2021-12-03 DIAGNOSIS — E041 Nontoxic single thyroid nodule: Secondary | ICD-10-CM

## 2022-02-01 ENCOUNTER — Telehealth: Payer: Self-pay | Admitting: Gastroenterology

## 2022-02-01 NOTE — Telephone Encounter (Signed)
Hello Dr. Myrtie Neither, ? ?Patient is requesting you as GI provider. ? ?We received a referral from PCP for chronic abd pain. We have records from [facility]. We also have operative/pathology report from [facility] where patient had colonoscopy/EGD: 09/05/18. Patient states he would like to return to [facility] for a second opinion. Will send records for review. Please advise on scheduling.  ? ?Thank you ?

## 2022-02-08 NOTE — Telephone Encounter (Signed)
This is a previous patient of Dr. Ardis Hughs. ?Due to current referral on patient volume, I am not currently accepting transfers of care from within our practice. ? ?I have placed the records in Dr. Audelia Acton box for him to review when he is back in the office to see if he is agreeable to the return of care. ? ?- HD ?

## 2022-10-14 ENCOUNTER — Encounter (HOSPITAL_BASED_OUTPATIENT_CLINIC_OR_DEPARTMENT_OTHER): Payer: Self-pay

## 2022-10-14 ENCOUNTER — Other Ambulatory Visit: Payer: Self-pay

## 2022-10-14 ENCOUNTER — Emergency Department (HOSPITAL_BASED_OUTPATIENT_CLINIC_OR_DEPARTMENT_OTHER)
Admission: EM | Admit: 2022-10-14 | Discharge: 2022-10-14 | Disposition: A | Discharge status: Home / Self Care | Payer: Commercial Managed Care - HMO | Attending: Emergency Medicine | Admitting: Emergency Medicine

## 2022-10-14 ENCOUNTER — Other Ambulatory Visit (HOSPITAL_BASED_OUTPATIENT_CLINIC_OR_DEPARTMENT_OTHER): Payer: Self-pay

## 2022-10-14 DIAGNOSIS — S50861A Insect bite (nonvenomous) of right forearm, initial encounter: Secondary | ICD-10-CM | POA: Diagnosis present

## 2022-10-14 DIAGNOSIS — W57XXXA Bitten or stung by nonvenomous insect and other nonvenomous arthropods, initial encounter: Secondary | ICD-10-CM | POA: Diagnosis not present

## 2022-10-14 MED ORDER — TRIAMCINOLONE ACETONIDE 0.1 % EX CREA
1.0000 | TOPICAL_CREAM | Freq: Two times a day (BID) | CUTANEOUS | 0 refills | Status: AC
  Filled 2022-10-14: qty 30, 15d supply, fill #0

## 2022-10-14 MED ORDER — HYDROCORTISONE 1 % EX CREA
TOPICAL_CREAM | CUTANEOUS | 0 refills | Status: AC
  Filled 2022-10-14: qty 28, 10d supply, fill #0

## 2022-10-14 NOTE — Discharge Instructions (Addendum)
You will be sent a prescription for triamcinolone cream, you may use this to your body. You will also be sent a prescription for hydrocortisone cream that you may use to your face for no more than 3 days.  You may also take over-the-counter Benadryl as directed to aid with your symptoms.  You may follow-up with your care team regarding today's ED visit.  Ensure that you clean your bedding with hot water and dry with high heat.  Return to the ED for any increasing/worsening symptoms.

## 2022-10-14 NOTE — ED Provider Notes (Signed)
[facility] EMERGENCY DEPARTMENT Provider Note   CSN: [id number] Arrival date & time: 10/14/22  1624     History  Chief Complaint  Patient presents with   Insect Bite    Herbert Cantu is a 61 y.o. male who presents to the emergency department with concerns for bedbugs.  Notes that he was sleeping on an old mattress that was infested with bedbugs for 2.5 months.  Notes that he has since gotten rid of the mattress and had insect bomb deployed in his apartment to aid with the infestation.  Patient notes he has been evaluated by [facility] walk-in clinic and started on permethrin cream as well as a shampoo.  Denies trying any other medications at home for symptoms.  Denies fever, redness, drainage.  Patient notes that he feels like the bedbugs have been laying eggs on him.  The history is provided by the patient. No language interpreter was used.       Home Medications Prior to Admission medications   Medication Sig Start Date End Date Taking? Authorizing Provider  hydrocortisone cream 1 % Apply to affected area 2 times daily 10/14/22  Yes Lashana Spang A, PA-C  triamcinolone cream (KENALOG) 0.1 % Apply 1 Application topically 2 (two) times daily. 10/14/22  Yes Sathvik Tiedt A, PA-C  acetaminophen (TYLENOL) 500 MG tablet Take 500 mg by mouth every 6 (six) hours as needed.    [provider]  aspirin EC 81 MG tablet Take 81 mg by mouth at bedtime.    [provider]  Cholecalciferol (D3 VITAMIN PO) Take by mouth. daily    [provider]  Cyanocobalamin (VITAMIN B-12) 5000 MCG SUBL Place under the tongue daily.    [provider]  cyclobenzaprine (FLEXERIL) 5 MG tablet Take 1 tablet (5 mg total) by mouth at bedtime as needed for muscle spasms. Patient not taking: Reported on 02/02/2018 06/27/17   Butch Penny, NP  gabapentin (NEURONTIN) 300 MG capsule Take 1 capsule (300 mg total) by mouth at bedtime. Patient not taking: Reported on 02/02/2018  05/30/17   Anson Fret, MD  ibuprofen (ADVIL,MOTRIN) 200 MG tablet Take 200 mg by mouth every 6 (six) hours as needed.    [provider]  LORazepam (ATIVAN) 1 MG tablet Take 1 mg by mouth at bedtime.  04/17/17   [provider]  losartan (COZAAR) 50 MG tablet Take 50 mg by mouth daily.    [provider]  Magnesium 500 MG TABS Take by mouth. One tablet qhs    [provider]  Multiple Vitamin (MULTIVITAMIN) tablet Take 1 tablet by mouth daily.    [provider]  omeprazole (PRILOSEC) 40 MG capsule Take 1 capsule (40 mg total) by mouth daily. Patient not taking: Reported on 02/02/2018 06/29/17   Unk Lightning, PA  vitamin C (ASCORBIC ACID) 500 MG tablet Take 500 mg by mouth daily as needed.    [provider]      Allergies    Cymbalta [duloxetine hcl]    Review of Systems   Review of Systems  All other systems reviewed and are negative.   Physical Exam Updated Vital Signs BP (!) 142/100   Pulse 77   Temp 97.8 F (36.6 C) (Oral)   Resp 16   Ht 5\' 8"  (1.727 m)   Wt 97.5 kg   SpO2 99%   BMI 32.69 kg/m  Physical Exam Vitals and nursing note reviewed.  Constitutional:  General: He is not in acute distress.    Appearance: Normal appearance.     Comments: Able to speak in clear complete sentences.  Tolerating secretions  Eyes:     General: No scleral icterus.    Extraocular Movements: Extraocular movements intact.  Cardiovascular:     Rate and Rhythm: Normal rate.  Pulmonary:     Effort: Pulmonary effort is normal. No respiratory distress.  Abdominal:     Palpations: Abdomen is soft. There is no mass.     Tenderness: There is no abdominal tenderness.  Musculoskeletal:        General: Normal range of motion.     Cervical back: Neck supple.  Skin:    General: Skin is warm and dry.     Findings: No rash.     Comments: Erythematous papules noted to left hand.  No discernible rash noted to trunk or legs.   Evidence of old healing bedbug bites noted to right forearm.  Neurological:     Mental Status: He is alert.     Sensory: Sensation is intact.     Motor: Motor function is intact.  Psychiatric:        Behavior: Behavior normal.     ED Results / Procedures / Treatments   Labs (all labs ordered are listed, but only abnormal results are displayed) Labs Reviewed - No data to display  EKG None  Radiology No results found.  Procedures Procedures    Medications Ordered in ED Medications - No data to display  ED Course/ Medical Decision Making/ A&P                           Medical Decision Making Risk Prescription drug management.   Patient with concerns for insect bites onset 2.5 months.  Has been treated with permethrin cream. Vital signs pt afebrile. On exam, patient with erythematous papules noted to left hand.  No discernible rash noted to trunk or legs.  Evidence of old healing bedbug bites noted to right forearm. Differential diagnosis includes cellulitis, scabies, contact dermatitis, insect bite.    Disposition: Presenting suspicious for likely bedbugs as cause of patient's symptoms.  Doubt concerns at this time for scabies, patient is currently being treated with permethrin cream.  Doubt concerns for contact dermatitis at this time.  After consideration of the diagnostic results and the patients response to treatment, I feel that the patient would benefit from Discharge home.  Patient sent a prescription for Kenalog and hydrocortisone cream.  Supportive care and return precautions discussed with patient.  Patient acknowledges and voices understanding.  Patient appears safe for discharge at this time.  Follow-up as indicated in discharge paperwork.  This chart was dictated using voice recognition software, Dragon. Despite the best efforts of this provider to proofread and correct errors, errors may still occur which can change documentation meaning.  Final Clinical  Impression(s) / ED Diagnoses Final diagnoses:  Insect bite, unspecified site, initial encounter    Rx / DC Orders ED Discharge Orders          Ordered    triamcinolone cream (KENALOG) 0.1 %  2 times daily        10/14/22 1659    hydrocortisone cream 1 %        10/14/22 1659              Theodoros Stjames A, PA-C 10/14/22 1746    Gwyneth Sprout, MD 10/14/22 2336

## 2022-10-14 NOTE — ED Triage Notes (Signed)
Pt reports sleeping on old mattress that was infested with bed bugs for 2 1/2 months . Conts to feel like he has bugs crawling on him Noted to have scratch marks on hands.. Says forehead is red and coughing up mucous

## 2022-10-20 ENCOUNTER — Other Ambulatory Visit: Payer: Self-pay

## 2022-10-20 ENCOUNTER — Emergency Department (HOSPITAL_BASED_OUTPATIENT_CLINIC_OR_DEPARTMENT_OTHER): Payer: Commercial Managed Care - HMO

## 2022-10-20 ENCOUNTER — Encounter (HOSPITAL_BASED_OUTPATIENT_CLINIC_OR_DEPARTMENT_OTHER): Payer: Self-pay | Admitting: Emergency Medicine

## 2022-10-20 ENCOUNTER — Emergency Department (HOSPITAL_BASED_OUTPATIENT_CLINIC_OR_DEPARTMENT_OTHER)
Admission: EM | Admit: 2022-10-20 | Discharge: 2022-10-20 | Disposition: A | Discharge status: Home / Self Care | Payer: Commercial Managed Care - HMO | Attending: Emergency Medicine | Admitting: Emergency Medicine

## 2022-10-20 DIAGNOSIS — F22 Delusional disorders: Secondary | ICD-10-CM | POA: Diagnosis present

## 2022-10-20 DIAGNOSIS — Z7982 Long term (current) use of aspirin: Secondary | ICD-10-CM | POA: Insufficient documentation

## 2022-10-20 DIAGNOSIS — G2581 Restless legs syndrome: Secondary | ICD-10-CM | POA: Insufficient documentation

## 2022-10-20 MED ORDER — ALBUTEROL SULFATE HFA 108 (90 BASE) MCG/ACT IN AERS: 2.0000 | INHALATION_SPRAY | RESPIRATORY_TRACT | Status: DC | PRN

## 2022-10-20 NOTE — Discharge Instructions (Signed)
On your ER visit today, you do not have evidence of parasites or bugs. This may be a physical manifestation of anxiety and is most effectively managed by Mental Health.

## 2022-10-20 NOTE — ED Provider Notes (Incomplete)
[facility] EMERGENCY DEPARTMENT Provider Note   CSN: [id number] Arrival date & time: 10/20/22  2014     History {Add pertinent medical, surgical, social history, OB history to HPI:1} Chief Complaint  Patient presents with   Shortness of Breath    Herbert Cantu is a 61 y.o. male.  HPI     Home Medications Prior to Admission medications   Medication Sig Start Date End Date Taking? Authorizing Provider  acetaminophen (TYLENOL) 500 MG tablet Take 500 mg by mouth every 6 (six) hours as needed.    [provider]  aspirin EC 81 MG tablet Take 81 mg by mouth at bedtime.    [provider]  Cholecalciferol (D3 VITAMIN PO) Take by mouth. 43mcg daily    [provider]  Cyanocobalamin (VITAMIN B-12) 5000 MCG SUBL Place under the tongue daily.    [provider]  cyclobenzaprine (FLEXERIL) 5 MG tablet Take 1 tablet (5 mg total) by mouth at bedtime as needed for muscle spasms. Patient not taking: Reported on 02/02/2018 06/27/17   Ward Givens, NP  gabapentin (NEURONTIN) 300 MG capsule Take 1 capsule (300 mg total) by mouth at bedtime. Patient not taking: Reported on 02/02/2018 05/30/17   Melvenia Beam, MD  hydrocortisone cream 1 % Apply to affected area 2 times daily 10/14/22   Blue, Soijett A, PA-C  ibuprofen (ADVIL,MOTRIN) 200 MG tablet Take 200 mg by mouth every 6 (six) hours as needed.    [provider]  LORazepam (ATIVAN) 1 MG tablet Take 1 mg by mouth at bedtime.  04/17/17   [provider]  losartan (COZAAR) 50 MG tablet Take 50 mg by mouth daily.    [provider]  Magnesium 500 MG TABS Take by mouth. One tablet qhs    [provider]  Multiple Vitamin (MULTIVITAMIN) tablet Take 1 tablet by mouth daily.    [provider]  omeprazole (PRILOSEC) 40 MG capsule Take 1 capsule (40 mg total) by mouth daily. Patient not taking: Reported on 02/02/2018 06/29/17   Levin Erp, PA   triamcinolone cream (KENALOG) 0.1 % Apply 1 Application topically 2 (two) times daily. 10/14/22   Blue, Soijett A, PA-C  vitamin C (ASCORBIC ACID) 500 MG tablet Take 500 mg by mouth daily as needed.    [provider]      Allergies    Cymbalta [duloxetine hcl]    Review of Systems   Review of Systems  Physical Exam Updated Vital Signs BP 134/89 (BP Location: Right Arm)   Pulse 89   Temp 98.2 F (36.8 C) (Oral)   Resp 16   Ht 5\' 8"  (1.727 m)   Wt 95.3 kg   SpO2 99%   BMI 31.93 kg/m  Physical Exam  ED Results / Procedures / Treatments   Labs (all labs ordered are listed, but only abnormal results are displayed) Labs Reviewed - No data to display  EKG None  Radiology DG Chest Port 1 View  Result Date: 10/20/2022 CLINICAL DATA:  Chest pain, shortness of breath EXAM: PORTABLE CHEST 1 VIEW COMPARISON:  Chest radiograph dated December 25, 2016 FINDINGS: The heart size and mediastinal contours are within normal limits. Both lungs are clear. The visualized skeletal structures are unremarkable. IMPRESSION: No active disease. Electronically Signed   By: Keane Police D.O.   On: 10/20/2022 20:53    Procedures Procedures  {Document cardiac monitor, telemetry assessment procedure when appropriate:1}  Medications Ordered in ED Medications  albuterol (  VENTOLIN HFA) 108 (90 Base) MCG/ACT inhaler 2 puff (has no administration in time range)    ED Course/ Medical Decision Making/ A&P                           Medical Decision Making Amount and/or Complexity of Data Reviewed Radiology: ordered.  Risk Prescription drug management.   ***  {Document critical care time when appropriate:1} {Document review of labs and clinical decision tools ie heart score, Chads2Vasc2 etc:1}  {Document your independent review of radiology images, and any outside records:1} {Document your discussion with family members, caretakers, and with consultants:1} {Document social  determinants of health affecting pt's care:1} {Document your decision making why or why not admission, treatments were needed:1} Final Clinical Impression(s) / ED Diagnoses Final diagnoses:  None    Rx / DC Orders ED Discharge Orders     None

## 2022-10-20 NOTE — ED Provider Notes (Signed)
[facility] EMERGENCY DEPARTMENT  Provider Note  CSN: [id number] Arrival date & time: 10/20/22 2014  History Chief Complaint  Patient presents with   Shortness of Breath    Herbert Cantu is a 61 y.o. male here for a long list of vague complaints dating back to 2018, including fatigue, trouble breathing/swallowing but in recent weeks he has been concerned about bedbugs and itching rash on his body. He reports he had bedbugs at home, but he replaced the mattress and 'bug bombed' the house. However he was still having symptoms so he moved into a motel where he reports the bugs have continued to bite him and lay eggs on him. He is not clear on what made him decide to come to the ED today but he reported SOB and trouble breathing to triage and concern for an allergic reaction.    Home Medications Prior to Admission medications   Medication Sig Start Date End Date Taking? Authorizing Provider  acetaminophen (TYLENOL) 500 MG tablet Take 500 mg by mouth every 6 (six) hours as needed.    [provider]  aspirin EC 81 MG tablet Take 81 mg by mouth at bedtime.    [provider]  Cholecalciferol (D3 VITAMIN PO) Take by mouth. daily    [provider]  Cyanocobalamin (VITAMIN B-12) 5000 MCG SUBL Place under the tongue daily.    [provider]  cyclobenzaprine (FLEXERIL) 5 MG tablet Take 1 tablet (5 mg total) by mouth at bedtime as needed for muscle spasms. Patient not taking: Reported on 02/02/2018 06/27/17   Butch Penny, NP  gabapentin (NEURONTIN) 300 MG capsule Take 1 capsule (300 mg total) by mouth at bedtime. Patient not taking: Reported on 02/02/2018 05/30/17   Anson Fret, MD  hydrocortisone cream 1 % Apply to affected area 2 times daily 10/14/22   Blue, Soijett A, PA-C  ibuprofen (ADVIL,MOTRIN) 200 MG tablet Take 200 mg by mouth every 6 (six) hours as needed.    [provider]  LORazepam (ATIVAN) 1 MG tablet Take 1 mg by mouth  at bedtime.  04/17/17   [provider]  losartan (COZAAR) 50 MG tablet Take 50 mg by mouth daily.    [provider]  Magnesium 500 MG TABS Take by mouth. One tablet qhs    [provider]  Multiple Vitamin (MULTIVITAMIN) tablet Take 1 tablet by mouth daily.    [provider]  omeprazole (PRILOSEC) 40 MG capsule Take 1 capsule (40 mg total) by mouth daily. Patient not taking: Reported on 02/02/2018 06/29/17   Unk Lightning, PA  triamcinolone cream (KENALOG) 0.1 % Apply 1 Application topically 2 (two) times daily. 10/14/22   Blue, Soijett A, PA-C  vitamin C (ASCORBIC ACID) 500 MG tablet Take 500 mg by mouth daily as needed.    [provider]     Allergies    Cymbalta [duloxetine hcl]   Review of Systems   Review of Systems Please see HPI for pertinent positives and negatives  Physical Exam BP 134/89 (BP Location: Right Arm)   Pulse 89   Temp 98.2 F (36.8 C) (Oral)   Resp 16   Ht 5\' 8"  (1.727 m)   Wt 95.3 kg   SpO2 99%   BMI 31.93 kg/m   Physical Exam Vitals and nursing note reviewed.  Constitutional:      Appearance: Normal appearance.  HENT:     Head: Normocephalic and atraumatic.     Nose:  Nose normal.     Mouth/Throat:     Mouth: Mucous membranes are moist.  Eyes:     Extraocular Movements: Extraocular movements intact.     Conjunctiva/sclera: Conjunctivae normal.  Cardiovascular:     Rate and Rhythm: Normal rate.  Pulmonary:     Effort: Pulmonary effort is normal.     Breath sounds: Normal breath sounds. No wheezing or rhonchi.  Abdominal:     General: Abdomen is flat.     Palpations: Abdomen is soft.     Tenderness: There is no abdominal tenderness.  Musculoskeletal:        General: No swelling. Normal range of motion.     Cervical back: Neck supple.  Skin:    General: Skin is warm and dry.     Comments: Healing excoriations on hands, no rash or signs of infestation in the areas he points to on back of  head and neck.   Neurological:     General: No focal deficit present.     Mental Status: He is alert.  Psychiatric:        Mood and Affect: Mood normal.     ED Results / Procedures / Treatments   EKG EKG Interpretation  Date/Time:  Thursday October 20 2022 20:38:48 EST Ventricular Rate:  87 PR Interval:  146 QRS Duration: 136 QT Interval:  382 QTC Calculation: 459 R Axis:   -70 Text Interpretation: Normal sinus rhythm Left axis deviation Right bundle branch block Possible Lateral infarct , age undetermined Abnormal ECG When compared with ECG of 25-Dec-2016 11:07, No significant change since last tracing Confirmed by Susy Frizzle [phone]) on 10/20/2022 11:28:33 PM  Procedures Procedures  Medications Ordered in the ED Medications  albuterol (VENTOLIN HFA) 108 (90 Base) MCG/ACT inhaler 2 puff (has no administration in time range)    Initial Impression and Plan  Patient here with multiple, long standing vague constitutional symptoms. Now has concerns of bedbug infestation. He has already been treated by PCP with Permethrin and has been given steroid cream in the ED about a week ago. I discussed the possibility of a delusional parasitosis but patient is convinced that he has bugs crawling on him. He was advised to take a sample of said bugs to his PCP for identification if he is able to catch one. Otherwise recommend follow up with Mental Health for long term management. I personally viewed the images from radiology studies and agree with radiologist interpretation: CXR is clear. No indication for further ED workup.    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 Decision-making details documented in ED Course. ECG/medicine tests: ordered and independent interpretation  performed. Decision-making details documented in ED Course.  Risk Prescription drug management.    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

## 2022-10-20 NOTE — ED Triage Notes (Signed)
Pt reports he continues to have "an allergic reaction to the bedbugs".  Pt was seen for the same on 10/09/22 and has since moved into a hotel however states he still sees the bugs.  Pt states he feels his throat is closing up however he does not appear to be in any respiratory distress and is able to speak in complete sentences.  Pt states he feels "inflamed feeling in his chest."  Pt states he can not keep his eye open from the reaction.  These symptoms have been going on for a month.

## 2022-10-22 ENCOUNTER — Ambulatory Visit (HOSPITAL_COMMUNITY)
Admission: EM | Admit: 2022-10-22 | Discharge: 2022-10-22 | Disposition: A | Discharge status: Home / Self Care | Payer: Commercial Managed Care - HMO | Attending: Psychiatry | Admitting: Psychiatry

## 2022-10-22 DIAGNOSIS — F4323 Adjustment disorder with mixed anxiety and depressed mood: Secondary | ICD-10-CM | POA: Diagnosis not present

## 2022-10-22 MED ORDER — HYDROXYZINE HCL 25 MG PO TABS: 25.0000 mg | ORAL_TABLET | Freq: Three times a day (TID) | ORAL | 0 refills | Status: AC | PRN

## 2022-10-22 NOTE — ED Notes (Signed)
Patient was discharged to home by provider. Patient was given AVS with community resources and a prescription.

## 2022-10-22 NOTE — ED Provider Notes (Signed)
Behavioral Health Urgent Care Medical Screening Exam  Patient Name: Herbert Cantu MRN: [id number] Date of Evaluation: 10/22/22 Chief Complaint:   Diagnosis:  Final diagnoses:  Adjustment disorder with mixed anxiety and depressed mood    History of Present illness: Herbert Cantu is a 61 y.o. male Herbert Cantu 61 y.o., male patient presented to [facility] as a walk in accompanied by his brother in law "Herbert Cantu" with complaints of " I need an antibiotic".   Herbert Cantu, 61 y.o., male patient seen face to face by this provider and chart reviewed on 10/22/22.   Per chart review patient was seen in [facility] ED on 10/20/2022 and diagnosed with Ekboms Delusional Parasitosis.  He has no psychiatric services in place.  Reports he is not prescribed any medications except for Ambien.  He lives alone and is unemployed.  Has denies any substance use.  Patient has been living in the [employer] [address] and reports he has bad bed bugs in the past. States the bed bugs are back and they are making him feel sick.  He complains of constant itching.  He sees bedbug residue on him at times. He has been treated with cream and shampoos for bedbugs in the past. He complains of having overall poor health.He has scabbed areas on the top part his hands. He denies having any scabbed or inflamed areas anywhere else on his body.  He denies any immediate/emergent medical concerns including shortness of breath and chest pain.  Discussed with patient the possibility that his bedbugs could be a  delusion and he is adamant that they are real.  With the patient's permission this provider talked with his brother in law "Herbert Cantu" who was in the lobby.  Herbert Cantu is unsure if the bedbugs are real.  States patient lives alone in a hotel.  No one has asked the hotel management to check the room for bedbugs.  Discussed with patient and his brother-in-law that it would be recommended to have management check room for bedbugs.  Herbert Cantu also states that over the past 5  years patient has had a steady decline and is no longer employed. He has a degree in turf management, but roughly 5 years ago he had some medical concerns and has not been the same since that time. States patient moves from one physical complaint to the next.  Patient has support system is his brother in law and multiple sisters.   During evaluation Herbert Cantu is observed sitting in the assessment room in no acute distress.  He is casually dressed and makes fair eye contact.  He has normal speech.  He is alert/oriented x 4, cooperative, and attentive.  He appears anxious.  When asked if he is experiencing any depressive symptoms he initially denied.  He then states that he endorses decreased focus, low energy, and irritability, but this is due to the bedbug infestation.  He denies any concerns with sleep because he takes Ambien when needed.  He denies any concerns with appetite. He denies any recent stressors/triggers.  He denies any SI/HI/AVH.  He denies any paranoid thought content.  He does not appear to be responding to internal/external stimuli.  He is able to answer questions appropriately.  He does not appear manic or psychotic.    Discussed with patient that it is possible his bedbug infestation is a delusion. Discussed treatment options that included inpatient psychiatric treatment and patient declines.  He does not meet any criteria for involuntary commitment.  Discussed outpatient psychiatric resources  including medication management and therapy resources and patient adamantly declined.  However resources were provided on patient's AVS.  Patient did ask for medications for anxiety.  Educated on hydroxyzine 25 mg 3 times daily as needed, printed prescription provided.   At this time Herbert Cantu is educated and verbalizes understanding of mental health resources and other crisis services in the community. He is instructed to call 911 and present to the nearest emergency room should he experience  any suicidal/homicidal ideation, auditory/visual/hallucinations, or detrimental worsening of his mental health condition.  He was a also advised by Clinical research associate that he could call the toll-free phone on back of  insurance card to assist with identifying in network counselors and agencies or number on back of Medicaid card to speak with care coordinator  Flowsheet Row ED from 10/20/2022 in [facility] [place] [facility] ED from 10/14/2022 in [facility] [place] [facility]  C-SSRS RISK CATEGORY No Risk No Risk       Psychiatric Specialty Exam  Presentation  General Appearance:Casual  Eye Contact:Fair  Speech:Clear and Coherent; Normal Rate  Speech Volume:Normal  Handedness:Right   Mood and Affect  Mood: Anxious; Dysphoric  Affect: Congruent   Thought Process  Thought Processes: Coherent  Descriptions of Associations:Intact  Orientation:Full (Time, Place and Person)  Thought Content:Logical    Hallucinations:None  Ideas of Reference:None  Suicidal Thoughts:No  Homicidal Thoughts:No   Sensorium  Memory: Immediate Good; Recent Good; Remote Good  Judgment: Good  Insight: Good   Executive Functions  Concentration: Fair  Attention Span: Fair  Recall: Good  Fund of Knowledge: Good  Language: Good   Psychomotor Activity  Psychomotor Activity: Normal   Assets  Assets: Communication Skills; Desire for Improvement; Physical Health; Resilience; Social Support; Leisure Time   Sleep  Sleep: Good  Number of hours: No data recorded  No data recorded  Physical Exam: Physical Exam Vitals and nursing note reviewed.  Constitutional:      General: He is not in acute distress.    Appearance: He is well-developed.  HENT:     Head: Normocephalic and atraumatic.  Eyes:     General:        Right eye: No discharge.        Left eye: No discharge.     Conjunctiva/sclera: Conjunctivae normal.  Cardiovascular:     Rate and  Rhythm: Normal rate and regular rhythm.     Heart sounds: No murmur heard. Pulmonary:     Effort: Pulmonary effort is normal. No respiratory distress.     Breath sounds: Normal breath sounds.  Abdominal:     Palpations: Abdomen is soft.     Tenderness: There is no abdominal tenderness.  Musculoskeletal:        General: No swelling. Normal range of motion.     Cervical back: Neck supple.  Skin:    General: Skin is dry.     Coloration: Skin is not jaundiced or pale.          Comments: Has a few scabbed areas on both hands from scratching   Neurological:     Mental Status: He is alert and oriented to person, place, and time.  Psychiatric:        Attention and Perception: Attention and perception normal.        Mood and Affect: Mood is anxious.        Speech: Speech normal.        Behavior: Behavior normal. Behavior is cooperative.  Thought Content: Thought content normal.        Cognition and Memory: Cognition normal.        Judgment: Judgment normal.    Review of Systems  Constitutional: Negative.   HENT: Negative.    Eyes: Negative.   Respiratory: Negative.    Cardiovascular: Negative.   Musculoskeletal: Negative.   Skin:  Positive for itching.  Neurological: Negative.   Psychiatric/Behavioral:  The patient is nervous/anxious.    Blood pressure 127/89, pulse 78, temperature 98 F (36.7 C), temperature source Oral, resp. rate 18, height 5\' 8"  (1.727 m), weight 210 lb (95.3 kg), SpO2 97 %. Body mass index is 31.93 kg/m.  Musculoskeletal: Strength & Muscle Tone: within normal limits Gait & Station: normal Patient leans: N/A   [facility] MSE Discharge Disposition for Follow up and Recommendations: Based on my evaluation the patient does not appear to have an emergency medical condition and can be discharged with resources and follow up care in outpatient services for Medication Management and Individual Therapy.   Discharge patient  Provided outpatient psychiatric  resources for medication management and therapy.  Provided printed prescription for hydroxyzine 25 mg 3 times daily anxiety/.   , NP 10/22/2022, 3:27 PM

## 2022-10-22 NOTE — Progress Notes (Signed)
Herbert Cantu Routine: patient is a 61 year old male that presents to [facility] voluntary as a lack-in bought in by his brother-in-;aw with concerns that bed bugs are making him sick, for the past several months.  Patient denies any SI/HI/ of AVH. Patient stated that his residence had bedbugs months ago and he had to stay in a motel while the bedbugs were removed.  He continues to find bed bugs  and eggs on his body. He feels this is the cause of his physical problems, inability to sit up, bold sweats, inability to walk at time. Difficulty sleeping.   10/22/22 1335  [facility] Triage Screening (Walk-ins at [facility] only)  What Is the Reason for Your Visit/Call Today? Pt. presented with his brother -in-law due to feeling that he has bed-bugs on him that aremaking him sick. Pt feels that having the bed bugs are cauing him physical difficulties, cold sweats someimtes affects his ability to walk, and interrupts his sleep at night.  How Long Has This Been Causing You Problems? > than 6 months  Have You Recently Had Any Thoughts About Hurting Yourself? No  Are You Planning to Commit Suicide/Harm Yourself At This time? No  Have you Recently Had Thoughts About Hurting Someone Karolee Ohs? No  Are You Planning To Harm Someone At This Time? No  Are you currently experiencing any auditory, visual or other hallucinations? No (Pt. claims that there are bed bugs on him all the time.)  Have You Used Any Alcohol or Drugs in the Past 24 Hours? No  Do you have any current medical co-morbidities that require immediate attention? No  Clinician description of patient physical appearance/behavior: well-goomed, flat affected but engaged  What Do You Feel Would Help You the Most Today? Medication(s)  If access to [facility] was not available, would you have sought care in the Emergency Department? Yes  Determination of Need Routine (7 days)  Options For Referral Medication Management

## 2022-10-22 NOTE — Discharge Instructions (Signed)
The suicide prevention education provided includes the following: Suicide risk factors Suicide prevention and interventions National Suicide Hotline telephone number [facility] assessment telephone number [facility] Emergency Assistance 911 [facility] and/or Residential Mobile Crisis Unit telephone number

## 2022-10-25 ENCOUNTER — Ambulatory Visit (HOSPITAL_COMMUNITY)
Admission: EM | Admit: 2022-10-25 | Discharge: 2022-10-25 | Disposition: A | Discharge status: Home / Self Care | Payer: Commercial Managed Care - HMO

## 2022-10-25 DIAGNOSIS — F22 Delusional disorders: Secondary | ICD-10-CM

## 2022-10-25 NOTE — ED Provider Notes (Signed)
Behavioral Health Urgent Care Medical Screening Exam  Patient Name: Herbert Cantu MRN: [id number] Date of Evaluation: 10/25/22 Chief Complaint: "bed bugs" Diagnosis:  Final diagnoses:  Delusion (HCC)   History of Present illness: Herbert Cantu is a 61 y.o. male. Pt presents voluntarily to [facility] behavioral health for walk-in assessment.  Pt is accompanied by his brother in law, [place]. Pt is assessed face-to-face by nurse practitioner.   Herbert Cantu, 61 y.o., male patient seen face to face by this provider, and chart reviewed on 10/25/22. Per chart review, pt was seen on 10/14/22 at [facility] Emergency Department for insect bite, unspecified site, initial encounter; on 10/20/22 at [facility] Emergency Department for Ekbom's delusional parasitosis; on 10/22/22 at [facility] for adjustment disorder with mixed anxiety and depressed mood.   On evaluation Herbert Cantu reports presenting today due to "bed bugs". Pt reports he has been feeling fatigued and physically unwell since 2018. He attributes this to bed bugs that are "on my person". He states they are currently on his scalp. Scalp assessed, normocephalic, atraumatic, no evidence of insect bite marks or residue.  Pt denies suicidal, homicidal, violent ideation. He denies auditory visual hallucinations.  Pt denies alcohol, marijuana, crack/cocaine, other substance use.  Pt denies history of non suicidal self injurious behavior, suicide attempt or inpatient psychiatric hospitalization.  Pt denies access to a firearm or other weapon.  Pt reports he is living in a hotel room. He has been living in the hotel room for past week.  Pt is currently unemployed.  Pt reports he is prescribed ambien for sleep by his primary care provider.  Pt gave verbal consent for me to speak with his brother in law [place]. Per Raiford Noble, pt's family members and friends have been trying to encourage pt to seek  psychiatric treatment. He states pt is convinced about having bed bugs. Pt has been abusing his ambien prescription, taking more than prescribed. Pt has complaints about ongoing fatigue and not feeling well. Pt has made comment about needing morphine for pain and has reported having a friend who has been hoarding morphine.  Pt seen with Raiford Noble. Discussed possible inpatient admission. Pt declines inpatient admission. He does agree to outpatient medication management and counseling. Called [facility] and scheduled pt for new patient appointment on 11/02/22. Discussed if there are safety concerns in the meantime to call 911/EMS, go to the nearest emergency room or crisis facility. Discussed with Raiford Noble if there are safety concerns, pursuing IVC. Rick verbalized understanding.  Flowsheet Row ED from 10/20/2022 in [facility] [place] [facility] from 10/14/2022 in [facility] [place] [facility]  C-SSRS RISK CATEGORY No Risk No Risk       Psychiatric Specialty Exam  Presentation  General Appearance:Casual; Fairly Groomed  Eye Contact:Fair  Speech:Clear and Coherent; Normal Rate  Speech Volume:Normal  Handedness:Right   Mood and Affect  Mood: Anxious; Dysphoric; Depressed  Affect: Congruent; Blunt   Thought Process  Thought Processes: Coherent  Descriptions of Associations:Intact  Orientation:Full (Time, Place and Person)  Thought Content:Delusions    Hallucinations:None  Ideas of Reference:None  Suicidal Thoughts:No  Homicidal Thoughts:No   Sensorium  Memory: Immediate Good; Recent Good; Remote Good  Judgment: Intact  Insight: Shallow   Executive Functions  Concentration: Fair  Attention Span: Fair  Recall: Good  Fund of Knowledge: Good  Language: Good   Psychomotor Activity  Psychomotor Activity: Normal   Assets  Assets: Communication Skills; Desire for  Improvement; Financial Resources/Insurance;  Housing; Research scientist (medical); Transportation; Resilience   Sleep  Sleep: Fair  Number of hours: No data recorded  No data recorded  Physical Exam: Physical Exam Constitutional:      General: He is not in acute distress.    Appearance: He is not ill-appearing, toxic-appearing or diaphoretic.  Eyes:     General: No scleral icterus. Cardiovascular:     Rate and Rhythm: Normal rate.  Pulmonary:     Effort: Pulmonary effort is normal. No respiratory distress.  Neurological:     Mental Status: He is alert and oriented to person, place, and time.  Psychiatric:        Attention and Perception: Attention and perception normal.        Mood and Affect: Mood is anxious and depressed. Affect is blunt.        Speech: Speech normal.        Behavior: Behavior normal. Behavior is cooperative.        Thought Content: Thought content is delusional.        Cognition and Memory: Cognition and memory normal.    Review of Systems  Constitutional:  Positive for malaise/fatigue. Negative for chills and fever.  Respiratory:  Negative for shortness of breath.   Cardiovascular:  Negative for chest pain and palpitations.  Psychiatric/Behavioral:  Positive for depression. The patient is nervous/anxious.    Blood pressure (!) 131/90, pulse 92, temperature 98 F (36.7 C), resp. rate 18, SpO2 99 %. There is no height or weight on file to calculate BMI.  Musculoskeletal: Strength & Muscle Tone: within normal limits Gait & Station: normal Patient leans: N/A  [facility] MSE Discharge Disposition for Follow up and Recommendations: Based on my evaluation the patient does not appear to have an emergency medical condition and can be discharged with resources and follow up care in outpatient services for Medication Management and Individual Therapy  Lauree Chandler, NP 10/25/2022, 2:27 PM

## 2022-10-25 NOTE — Discharge Summary (Signed)
Herbert Cantu to be D/C'd Home per NP order. An After Visit Summary was printed and given to the patient by provider. Patient escorted out and D/C home via private auto.  [name]  10/25/2022 1:22 PM

## 2022-10-25 NOTE — BH Assessment (Signed)
Herbert Cantu, Routine; 61 year old presents this date with his brother-in-law.  Pt denies SI, HI or AVH/Drug/Alcohol use. Pt reports that bed-bugs are crawling across his skin.  Pt reports that the bugs and egg shells are causing him to feel sick.  Pt denies any prior MH diagnosis or prescribed medication for symptom management.  MSE signed by patient.

## 2022-10-25 NOTE — Discharge Instructions (Signed)
